# Patient Record
Sex: Male | Born: 1990 | Race: White | Hispanic: No | Marital: Single | Smoking: Never smoker
Health system: Southern US, Community
[De-identification: ages and names within clinical notes are randomized; demographics above are authoritative.]

## PROBLEM LIST (undated history)

## (undated) DIAGNOSIS — R519 Headache, unspecified: Secondary | ICD-10-CM

## (undated) DIAGNOSIS — F419 Anxiety disorder, unspecified: Secondary | ICD-10-CM

## (undated) DIAGNOSIS — R51 Headache: Secondary | ICD-10-CM

## (undated) HISTORY — DX: Headache, unspecified: R51.9

## (undated) HISTORY — DX: Headache: R51

## (undated) HISTORY — PX: PILONIDAL CYST DRAINAGE: SHX743

## (undated) HISTORY — PX: NO PAST SURGERIES: SHX2092

---
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
## 2012-08-05 ENCOUNTER — Encounter (HOSPITAL_COMMUNITY): Payer: Self-pay | Admitting: Emergency Medicine

## 2012-08-05 ENCOUNTER — Emergency Department (INDEPENDENT_AMBULATORY_CARE_PROVIDER_SITE_OTHER)
Admission: EM | Admit: 2012-08-05 | Discharge: 2012-08-05 | Disposition: A | Discharge status: Home / Self Care | Payer: Medicaid Other | Source: Home / Self Care | Attending: Family Medicine | Admitting: Family Medicine

## 2012-08-05 DIAGNOSIS — H6120 Impacted cerumen, unspecified ear: Secondary | ICD-10-CM

## 2012-08-05 DIAGNOSIS — H6122 Impacted cerumen, left ear: Secondary | ICD-10-CM

## 2012-08-05 NOTE — ED Notes (Signed)
Pt c/o bilateral clogged ears onset 1 week w/mild pain States he has this prob ever year  Denies: f/v/n/d, cold sx  He is alert and oriented w/no signs of acute distress.

## 2012-08-05 NOTE — ED Provider Notes (Signed)
History     CSN: [id number]  Arrival date & time 08/05/12  1331   First MD Initiated Contact with Patient 08/05/12 1436      Chief Complaint  Patient presents with  . Ear Fullness    (Consider location/radiation/quality/duration/timing/severity/associated sxs/prior treatment) Patient is a 22 y.o. male presenting with plugged ear sensation. The history is provided by the patient.  Ear Fullness This is a recurrent (recurrent cerumen impaction) problem. The current episode started more than 1 week ago. The problem has not changed since onset.   Past Medical History  Diagnosis Date  . Anxiety     History reviewed. No pertinent past surgical history.  Family History  Problem Relation Age of Onset  . Asthma Brother     History  Substance Use Topics  . Smoking status: Never Smoker   . Smokeless tobacco: Never Used  . Alcohol Use: No      Review of Systems  Constitutional: Negative.   HENT: Positive for hearing loss. Negative for ear pain and congestion.     Allergies  Review of patient's allergies indicates no known allergies.  Home Medications   Current Outpatient Rx  Name  Route  Sig  Dispense  Refill  . Multiple Vitamins-Minerals (MULTIVITAMINS THER. W/MINERALS) TABS   Oral   Take 1 tablet by mouth daily.             BP 145/90  Pulse 79  Temp(Src) 98.5 F (36.9 C) (Oral)  Resp 20  SpO2 98%  Physical Exam  Nursing note and vitals reviewed. Constitutional: He appears well-developed and well-nourished. No distress.  HENT:  Right Ear: External ear normal.  Left Ear: Decreased hearing is noted.  Ears:  Mouth/Throat: Oropharynx is clear and moist.    ED Course  EAR CERUMEN REMOVAL Date/Time: 08/05/2012 3:22 PM Performed by: Linna Hoff Authorized by: Bradd Canary D Consent: Verbal consent obtained. Consent given by: patient Ceruminolytics applied: Ceruminolytics applied prior to the procedure. Location details: left ear Procedure type:  irrigation Patient tolerance: Patient tolerated the procedure well with no immediate complications.   (including critical care time)  Labs Reviewed - No data to display No results found.   1. Cerumen impaction, left       MDM  Cerumen irrig, sx resolved.        Linna Hoff, MD 08/05/12 [phone]

## 2013-02-13 ENCOUNTER — Encounter (HOSPITAL_COMMUNITY): Payer: Self-pay | Admitting: Emergency Medicine

## 2013-02-13 ENCOUNTER — Emergency Department (HOSPITAL_COMMUNITY)
Admission: EM | Admit: 2013-02-13 | Discharge: 2013-02-13 | Disposition: A | Discharge status: Home / Self Care | Payer: Medicaid Other | Attending: Emergency Medicine | Admitting: Emergency Medicine

## 2013-02-13 DIAGNOSIS — R350 Frequency of micturition: Secondary | ICD-10-CM | POA: Insufficient documentation

## 2013-02-13 DIAGNOSIS — R300 Dysuria: Secondary | ICD-10-CM | POA: Insufficient documentation

## 2013-02-13 DIAGNOSIS — H6120 Impacted cerumen, unspecified ear: Secondary | ICD-10-CM | POA: Insufficient documentation

## 2013-02-13 DIAGNOSIS — R3911 Hesitancy of micturition: Secondary | ICD-10-CM | POA: Insufficient documentation

## 2013-02-13 DIAGNOSIS — R34 Anuria and oliguria: Secondary | ICD-10-CM | POA: Insufficient documentation

## 2013-02-13 DIAGNOSIS — Z8659 Personal history of other mental and behavioral disorders: Secondary | ICD-10-CM | POA: Insufficient documentation

## 2013-02-13 LAB — URINALYSIS, ROUTINE W REFLEX MICROSCOPIC
Bilirubin Urine: NEGATIVE
Hgb urine dipstick: NEGATIVE
Nitrite: NEGATIVE
Protein, ur: NEGATIVE mg/dL
Urobilinogen, UA: 1 mg/dL (ref 0.0–1.0)

## 2013-02-13 NOTE — ED Provider Notes (Signed)
CSN: [id number]     Arrival date & time 02/13/13  1844 History   None    This chart was scribed for Antony Madura, by Ladona Ridgel Day, ED scribe. This patient was seen in room WTR9/WTR9 and the patient's care was started at 1844.  Chief Complaint  Patient presents with  . Dysuria    hesitency with urination, pressure in bladder x 10 hrs  . Cerumen Impaction    recurrent wax in ears   The history is provided by the patient. No language interpreter was used.   HPI Comments: Austin Blanchard is a 22 y.o. male who presents to the Emergency Department complaining of urinary hesitancy and decreased duration of urine flow, onset this AM. He states he feels unable to void normal amount of urine and feels that he still needs to go after voiding. He states is not sexually active and not concerned for any STIs. He denies penile d/c or swelling and denies swelling/redness of scrotum. He denies associated dysuria, abdominal pain, nausea, emesis, back pain, fever, hematuria.   He also c/o excess cerumen in bilateral ears.  No medical hx.  Past Medical History  Diagnosis Date  . Anxiety    History reviewed. No pertinent past surgical history. Family History  Problem Relation Age of Onset  . Asthma Brother   . Diabetes Other    History  Substance Use Topics  . Smoking status: Never Smoker   . Smokeless tobacco: Never Used  . Alcohol Use: No    Review of Systems  Constitutional: Negative for fever and chills.  Respiratory: Negative for shortness of breath.   Gastrointestinal: Negative for nausea and vomiting.  Genitourinary: Positive for frequency and decreased urine volume. Negative for dysuria, hematuria, flank pain, discharge, penile swelling, scrotal swelling, penile pain and testicular pain.  Neurological: Negative for weakness.  All other systems reviewed and are negative.  A complete 10 system review of systems was obtained and all systems are negative except as noted in the HPI and  PMH.   Allergies  Review of patient's allergies indicates no known allergies.  Home Medications  No current outpatient prescriptions on file. Triage Vitals: BP 138/82  Pulse 88  Temp(Src) 98.1 F (36.7 C) (Oral)  Resp 20  SpO2 100%  Physical Exam  Nursing note and vitals reviewed. Constitutional: He is oriented to person, place, and time. He appears well-developed and well-nourished. No distress.  HENT:  Head: Normocephalic and atraumatic.  Right Ear: External ear normal.  Left Ear: External ear normal.  Cerumen appreciated in b/l canals without impaction. Canals and TMs normal b/l with erythema. No TTP on palpation of external ears b/l. Hearing grossly intact b/l.  Eyes: EOM are normal.  Neck: Neck supple. No tracheal deviation present.  Cardiovascular: Normal rate, regular rhythm, normal heart sounds and intact distal pulses.   No murmur heard. Pulmonary/Chest: Effort normal and breath sounds normal. No respiratory distress. He has no wheezes. He has no rales.  Abdominal: Soft. Bowel sounds are normal. He exhibits no distension. There is no tenderness. There is no rebound and no guarding.  Musculoskeletal: Normal range of motion. He exhibits no tenderness.  No CVA tenderness  Neurological: He is alert and oriented to person, place, and time.  Skin: Skin is warm and dry.  Psychiatric: He has a normal mood and affect. His behavior is normal.    ED Course  Procedures (including critical care time) DIAGNOSTIC STUDIES: Oxygen Saturation is 100% on room air, normal by  my interpretation.    COORDINATION OF CARE: At 810 PM Discussed treatment plan with patient which includes UA and culture, bladder scan . Patient agrees.   Labs Review Labs Reviewed  URINE CULTURE  URINALYSIS, ROUTINE W REFLEX MICROSCOPIC   Imaging Review No results found.  EKG Interpretation   None       MDM   1. Dysuria    Patient presents for sensation of incomplete bladder emptying with  urgency and frequency. Patient well and nontoxic appearing, hemodynamically stable, and afebrile. Physical exam unremarkable and UA non-suggestive of infection. Urine culture sent which is pending. Bladder scan normal - 62cc. Have recommended further work up with GC/Chlamydia culture, which patient declines at this time as he denies a hx of sexual activity. Believe patient's symptoms are secondary to bladder spasm which are likely transient. Still, however, will refer to urology for further evaluation of symptoms. Return precautions discussed and patient agreeable to plan with no unaddressed concerns. Patient stable for d/c.  I personally performed the services described in this documentation, which was scribed in my presence. The recorded information has been reviewed and is accurate.       Antony Madura, PA-C 02/21/13 2037

## 2013-02-13 NOTE — ED Notes (Signed)
Pt reports pressure, fullness and hesitancy on urination. Denies burning. Also, c/o recurrent wax in both ears

## 2013-02-13 NOTE — ED Notes (Signed)
Bladder scan 62ml. PA aware.

## 2013-02-14 LAB — URINE CULTURE

## 2013-02-24 NOTE — ED Provider Notes (Signed)
  Medical screening examination/treatment/procedure(s) were performed by non-physician practitioner and as supervising physician I was immediately available for consultation/collaboration.      Gerhard Munch, MD 02/24/13 1043

## 2015-01-03 ENCOUNTER — Emergency Department (HOSPITAL_COMMUNITY)
Admission: EM | Admit: 2015-01-03 | Discharge: 2015-01-03 | Disposition: A | Discharge status: Home / Self Care | Payer: Medicaid Other | Attending: Emergency Medicine | Admitting: Emergency Medicine

## 2015-01-03 ENCOUNTER — Encounter (HOSPITAL_COMMUNITY): Payer: Self-pay | Admitting: *Deleted

## 2015-01-03 DIAGNOSIS — L03811 Cellulitis of head [any part, except face]: Secondary | ICD-10-CM

## 2015-01-03 DIAGNOSIS — L731 Pseudofolliculitis barbae: Secondary | ICD-10-CM | POA: Insufficient documentation

## 2015-01-03 DIAGNOSIS — Z8659 Personal history of other mental and behavioral disorders: Secondary | ICD-10-CM | POA: Insufficient documentation

## 2015-01-03 MED ORDER — CEPHALEXIN 500 MG PO CAPS: 500.0000 mg | ORAL_CAPSULE | Freq: Two times a day (BID) | ORAL | Status: DC

## 2015-01-03 MED ORDER — CEPHALEXIN 500 MG PO CAPS
500.0000 mg | ORAL_CAPSULE | Freq: Once | ORAL | Status: AC
  Administered 2015-01-03: 500 mg via ORAL
  Filled 2015-01-03: qty 1

## 2015-01-03 NOTE — Discharge Instructions (Signed)
Cellulitis Cellulitis is an infection of the skin and the tissue beneath it. The infected area is usually red and tender. Cellulitis occurs most often in the arms and lower legs.  CAUSES  Cellulitis is caused by bacteria that enter the skin through cracks or cuts in the skin. The most common types of bacteria that cause cellulitis are staphylococci and streptococci. SIGNS AND SYMPTOMS   Redness and warmth.  Swelling.  Tenderness or pain.  Fever. DIAGNOSIS  Your health care provider can usually determine what is wrong based on a physical exam. Blood tests may also be done. TREATMENT  Treatment usually involves taking an antibiotic medicine. HOME CARE INSTRUCTIONS   Take your antibiotic medicine as directed by your health care provider. Finish the antibiotic even if you start to feel better.  Keep the infected arm or leg elevated to reduce swelling.  Apply a warm cloth to the affected area up to 4 times per day to relieve pain.  Take medicines only as directed by your health care provider.  Keep all follow-up visits as directed by your health care provider. SEEK MEDICAL CARE IF:   You notice red streaks coming from the infected area.  Your red area gets larger or turns dark in color.  Your bone or joint underneath the infected area becomes painful after the skin has healed.  Your infection returns in the same area or another area.  You notice a swollen bump in the infected area.  You develop new symptoms.  You have a fever. SEEK IMMEDIATE MEDICAL CARE IF:   You feel very sleepy.  You develop vomiting or diarrhea.  You have a general ill feeling (malaise) with muscle aches and pains. MAKE SURE YOU:   Understand these instructions.  Will watch your condition.  Will get help right away if you are not doing well or get worse. Document Released: 12/28/2004 Document Revised: 08/04/2013 Document Reviewed: 06/05/2011 [facility] Patient Information 2015 [place], [place].  This information is not intended to replace advice given to you by your health care provider. Make sure you discuss any questions you have with your health care provider.  Ingrown Hair An ingrown hair is a hair that curls and re-enters the skin instead of growing straight out of the skin. It happens most often with curly hair. It is usually more severe in the neck area, but it can occur in any shaved area, including the beard area, groin, scalp, and legs. An ingrown hair may cause small pockets of infection. CAUSES  Shaving closely, tweezing, or waxing, especially curly hair. Using hair removal creams can sometimes lead to ingrown hairs, especially in the groin. SYMPTOMS   Small bumps on the skin. The bumps may be filled with pus.  Pain.  Itching. DIAGNOSIS  Your caregiver can usually tell what is wrong by doing a physical exam. TREATMENT  If there is a severe infection, your caregiver may prescribe antibiotic medicines. Laser hair removal may also be done to help prevent regrowth of the hair. HOME CARE INSTRUCTIONS   Do not shave irritated skin. You may start shaving again once the irritation has gone away.  If you are prone to ingrown hairs, consider not shaving as much as possible.  If antibiotics are prescribed, take them as directed. Finish them even if you start to feel better.  You may use a facial sponge in a gentle circular motion to help dislodge ingrown hairs on the face.  You may use a hair removal cream weekly, especially on  the legs and underarms. Stop using the cream if it irritates your skin. Use caution when using hair removal creams in the groin area. SHAVING INSTRUCTIONS AFTER TREATMENT  Shower before shaving. Keep areas to be shaved packed in warm, moist wraps for several minutes before shaving. The warm, moist environment helps soften the hairs and makes ingrown hairs less likely to occur.  Use thick shaving gels.  Use a bump fighter razor that cuts hair  slightly above the skin level or use an electric shaver with a longer shave setting.  Shave in the direction of hair growth. Avoid making multiple razor strokes.  Use moisturizing lotions after shaving. Document Released: 06/26/2000 Document Revised: 09/19/2011 Document Reviewed: 06/20/2011 [facility] Patient Information 2015 [place], [place]. This information is not intended to replace advice given to you by your health care provider. Make sure you discuss any questions you have with your health care provider.

## 2015-01-03 NOTE — ED Provider Notes (Signed)
CSN: [id number]     Arrival date & time 01/03/15  1625 History   First MD Initiated Contact with Patient 01/03/15 1729     Chief Complaint  Patient presents with  . Headache  . Eye Pain    right     (Consider location/radiation/quality/duration/timing/severity/associated sxs/prior Treatment) HPI  Austin Blanchard is a 24 y.o. male  PCP: No PCP Per Patient  Blood pressure 133/81, pulse 103, temperature 98.1 F (36.7 C), temperature source Oral, resp. rate 18, height 6' (1.829 m), weight 275 lb (124.739 kg), SpO2 100 %.  SIGNIFICANT PMH: anxiety CHIEF COMPLAINT: bump on head, scalp pain.  When: 3 weeks ago patient noticed when he pushes on the bump then he gets shooting pains into his right eye but then he gets a migraine that last a few hours. It resolves with Ibuprofen.  Mechanism: pushing on the bump on his head Chronicity: past 3 weeks, never has happened before Location: top of scalp Radiation: raidates to right eye Quality and severity: shooting pain that lasts a second Treatments tried: Ibuprofen with relief Alleviating factors: Ibuprofen, not touching the bump Worsening factors: touching the bump Associated Symptoms: headaches, shooting pains Risk Factors: none.  Negative ROS: Confusion, diaphoresis, fever, eakness (general or focal), change of vision,  neck pain, dysphagia, aphagia, chest pain, shortness of breath,  back pain, abdominal pains, nausea, vomiting, diarrhea, lower extremity swelling,     Past Medical History  Diagnosis Date  . Anxiety    History reviewed. No pertinent past surgical history. Family History  Problem Relation Age of Onset  . Asthma Brother   . Diabetes Other    Social History  Substance Use Topics  . Smoking status: Never Smoker   . Smokeless tobacco: Never Used  . Alcohol Use: No    Review of Systems  10 Systems reviewed and are negative for acute change except as noted in the HPI.     Allergies  Review of patient's  allergies indicates no known allergies.  Home Medications   Prior to Admission medications   Medication Sig Start Date End Date Taking? Authorizing Provider  Cholecalciferol (VITAMIN D PO) Take 1 tablet by mouth 3 (three) times a week.   Yes Historical Provider, MD  ibuprofen (ADVIL,MOTRIN) 200 MG tablet Take 200 mg by mouth every 6 (six) hours as needed for headache or moderate pain.   Yes Historical Provider, MD  cephALEXin (KEFLEX) 500 MG capsule Take 1 capsule (500 mg total) by mouth 2 (two) times daily. 01/03/15   Aadon Gorelik Neva Seat, PA-C   BP 133/81 mmHg  Pulse 103  Temp(Src) 98.1 F (36.7 C) (Oral)  Resp 18  Ht 6' (1.829 m)  Wt 275 lb (124.739 kg)  BMI 37.29 kg/m2  SpO2 100% Physical Exam  Constitutional: He appears well-developed and well-nourished. No distress.  HENT:  Head: Normocephalic and atraumatic.  Right Ear: Tympanic membrane and ear canal normal.  Left Ear: Tympanic membrane and ear canal normal.  Nose: Nose normal.  Mouth/Throat: Uvula is midline and oropharynx is clear and moist.  Scalp has small pustule with associated cellulitis, cellulitis is approximately 2 x 3 cm in diameter. No associated fluctuance. I am able to illicit shooting pain when I press on the cellulitis.  Eyes: Pupils are equal, round, and reactive to light.  Neck: Normal range of motion. Neck supple.  Cardiovascular: Normal rate and regular rhythm.   Pulmonary/Chest: Effort normal.  Abdominal: Soft.  Neurological: He is alert.  Skin: Skin is warm  and dry.  Nursing note and vitals reviewed.   ED Course  Procedures (including critical care time) Labs Review Labs Reviewed - No data to display  Imaging Review No results found. I have personally reviewed and evaluated these images and lab results as part of my medical decision-making.   EKG Interpretation None      MDM   Final diagnoses:  Ingrown hair  Cellulitis of head except face    Afebrile, well appearing. Pt symptoms are  consistent with cellulitis associated with an ingrown hair, very mild. Will rx Keflex.   Presentation is non concerning for [facility], ICH, Meningitis, or temporal arteritis. Pt is afebrile with no focal neuro deficits, nuchal rigidity, or change in vision. The patient denies any symptoms of neurological impairment or TIA's; no amaurosis, diplopia, dysphasia, or unilateral disturbance of motor or sensory function. No loss of balance or vertigo.   Medications  cephALEXin (KEFLEX) capsule 500 mg (not administered)    24 y.o.[name]'s medical screening exam was performed and I feel the patient has had an appropriate workup for their chief complaint at this time and likelihood of emergent condition existing is low. They have been counseled on decision, discharge, follow up and which symptoms necessitate immediate return to the emergency department. They or their family verbally stated understanding and agreement with plan and discharged in stable condition.   Vital signs are stable at discharge. Filed Vitals:   01/03/15 1633  BP: 133/81  Pulse: 103  Temp: 98.1 F (36.7 C)  Resp: [address], PA-C 01/03/15 1832  Raeford Razor, MD 01/06/15 1425

## 2015-01-03 NOTE — ED Notes (Signed)
Pt stated "about 3 wks ago I noticed a bump on my head.  I didn't hit my head on anything.  It doesn't hurt until I press on it and then it causes the pain to go across my head into my eye.  I've also had a rash on my wrists for a few months.  The eye pain feels like a migraine."  Pt denies photophobia or nausea, but does have dizziness.

## 2015-05-10 ENCOUNTER — Emergency Department (HOSPITAL_COMMUNITY): Payer: Medicaid Other

## 2015-05-10 ENCOUNTER — Emergency Department (HOSPITAL_COMMUNITY)
Admission: EM | Admit: 2015-05-10 | Discharge: 2015-05-10 | Disposition: A | Discharge status: Home / Self Care | Payer: Self-pay | Attending: Emergency Medicine | Admitting: Emergency Medicine

## 2015-05-10 ENCOUNTER — Encounter (HOSPITAL_COMMUNITY): Payer: Self-pay | Admitting: Emergency Medicine

## 2015-05-10 DIAGNOSIS — R519 Headache, unspecified: Secondary | ICD-10-CM

## 2015-05-10 DIAGNOSIS — Z792 Long term (current) use of antibiotics: Secondary | ICD-10-CM | POA: Insufficient documentation

## 2015-05-10 DIAGNOSIS — R51 Headache: Secondary | ICD-10-CM | POA: Insufficient documentation

## 2015-05-10 DIAGNOSIS — Z8659 Personal history of other mental and behavioral disorders: Secondary | ICD-10-CM | POA: Insufficient documentation

## 2015-05-10 DIAGNOSIS — Z79899 Other long term (current) drug therapy: Secondary | ICD-10-CM | POA: Insufficient documentation

## 2015-05-10 NOTE — ED Notes (Signed)
Pt states he has a knot on the top of his head that has been causing him to have migraines. Says the pain is illicited with touch, and that the pain shoots into the back of his eyes. No visualized or palpable mass to head, no injury.

## 2015-05-10 NOTE — ED Provider Notes (Signed)
CSN: [id number]     Arrival date & time 05/10/15  1244 History   First MD Initiated Contact with Patient 05/10/15 1321     Chief Complaint  Patient presents with  . Migraine     (Consider location/radiation/quality/duration/timing/severity/associated sxs/prior Treatment) HPI Comments: Patient here complaining of long-standing pain to the top of his head times several months. Denies any visual changes. Denies any vomiting. Pain is characterized as sharp and worse with palpation and does radiate down to the right side of his neck. Denies any neck pain or weakness in his arms or legs. Patient states that sometimes it triggers a migraine. Is concerned that he might have an underlying mass and sprain and is requesting a head CT. He has never been seen by neurologist  Patient is a 25 y.o. male presenting with migraines. The history is provided by the patient and a parent.  Migraine    Past Medical History  Diagnosis Date  . Anxiety    History reviewed. No pertinent past surgical history. Family History  Problem Relation Age of Onset  . Asthma Brother   . Diabetes Other    Social History  Substance Use Topics  . Smoking status: Never Smoker   . Smokeless tobacco: Never Used  . Alcohol Use: No    Review of Systems  All other systems reviewed and are negative.     Allergies  Review of patient's allergies indicates no known allergies.  Home Medications   Prior to Admission medications   Medication Sig Start Date End Date Taking? Authorizing Provider  b complex vitamins tablet Take 1 tablet by mouth daily.   Yes Historical Provider, MD  Cholecalciferol (VITAMIN D PO) Take 1 tablet by mouth daily.    Yes Historical Provider, MD  ibuprofen (ADVIL,MOTRIN) 200 MG tablet Take 200 mg by mouth every 6 (six) hours as needed for headache or moderate pain.   Yes Historical Provider, MD  Multiple Vitamin (MULTIVITAMIN WITH MINERALS) TABS tablet Take 1 tablet by mouth daily.   Yes  Historical Provider, MD  cephALEXin (KEFLEX) 500 MG capsule Take 1 capsule (500 mg total) by mouth 2 (two) times daily. 01/03/15   Tiffany Neva Seat, PA-C   BP 143/87 mmHg  Pulse 97  Temp(Src) 98.1 F (36.7 C) (Oral)  Resp 16  SpO2 99% Physical Exam  Constitutional: He is oriented to person, place, and time. He appears well-developed and well-nourished.  Non-toxic appearance. No distress.  HENT:  Head: Normocephalic and atraumatic.  Eyes: Conjunctivae, EOM and lids are normal. Pupils are equal, round, and reactive to light.  Neck: Normal range of motion. Neck supple. No tracheal deviation present. No thyroid mass present.  Cardiovascular: Normal rate, regular rhythm and normal heart sounds.  Exam reveals no gallop.   No murmur heard. Pulmonary/Chest: Effort normal and breath sounds normal. No stridor. No respiratory distress. He has no decreased breath sounds. He has no wheezes. He has no rhonchi. He has no rales.  Abdominal: Soft. Normal appearance and bowel sounds are normal. He exhibits no distension. There is no tenderness. There is no rebound and no CVA tenderness.  Musculoskeletal: Normal range of motion. He exhibits no edema or tenderness.  Neurological: He is alert and oriented to person, place, and time. He has normal strength. No cranial nerve deficit or sensory deficit. GCS eye subscore is 4. GCS verbal subscore is 5. GCS motor subscore is 6.  Skin: Skin is warm and dry. No abrasion and no rash noted.  Psychiatric: He  has a normal mood and affect. His speech is normal and behavior is normal.  Nursing note and vitals reviewed.   ED Course  Procedures (including critical care time) Labs Review Labs Reviewed - No data to display  Imaging Review Ct Head Wo Contrast  05/10/2015  CLINICAL DATA:  Headache EXAM: CT HEAD WITHOUT CONTRAST TECHNIQUE: Contiguous axial images were obtained from the base of the skull through the vertex without intravenous contrast. COMPARISON:  04/12/2004  FINDINGS: Ventricle size is normal. Negative for acute or chronic infarction. Negative for hemorrhage or fluid collection. Negative for mass or edema. No shift of the midline structures. Calvarium is intact. IMPRESSION: Normal Electronically Signed   By: Marlan Palau M.D.   On: 05/10/2015 14:55   I have personally reviewed and evaluated these images and lab results as part of my medical decision-making.   EKG Interpretation None      MDM   Final diagnoses:  None    Head CT negative. Neurology referral given    Lorre Nick, MD 05/10/15 1538

## 2015-05-10 NOTE — Discharge Instructions (Signed)

## 2015-05-12 ENCOUNTER — Telehealth: Payer: Self-pay | Admitting: Neurology

## 2015-05-12 ENCOUNTER — Ambulatory Visit (INDEPENDENT_AMBULATORY_CARE_PROVIDER_SITE_OTHER): Payer: Self-pay | Admitting: Neurology

## 2015-05-12 ENCOUNTER — Encounter: Payer: Self-pay | Admitting: Neurology

## 2015-05-12 VITALS — BP 145/88 | HR 99 | Ht 72.0 in | Wt 274.0 lb

## 2015-05-12 DIAGNOSIS — R519 Headache, unspecified: Secondary | ICD-10-CM | POA: Insufficient documentation

## 2015-05-12 DIAGNOSIS — R51 Headache: Secondary | ICD-10-CM

## 2015-05-12 MED ORDER — AMITRIPTYLINE HCL 25 MG PO TABS: 25.0000 mg | ORAL_TABLET | Freq: Every day | ORAL | Status: DC

## 2015-05-12 NOTE — Progress Notes (Addendum)
PATIENT: Austin Blanchard DOB: 03-29-1991  Chief Complaint  Patient presents with  . Headache    He is here with his mother, Amedeo Gory.  He has been having a constant pain on the top of his head for he last 4-5 months.  At times, the pain radiates to the back of his right eye.     HISTORICAL  Austin Blanchard is a 25 years old right-handed male, accompanied by his mother, seen in refer by  emergency room for evaluation of headaches  He began to have headaches since summer of 2016, always triggered by deep pressure at a top of the vertex region, he would develop shooting pain to his right forehead, right retro-orbital area, 4 out of 10, with associated light noise sensitivity, occasionally nauseous, lasting for couple hours, during a headache, he preferred to lie down in dark quiet room, ibuprofen was helpful,  He reported, without pushing on the tenderness trigger spots, he would not have headache,  He actually presented to the emergency room May 10 2015, I have personally reviewed CAT scan of the brain without contrast was normal. REVIEW OF SYSTEMS: Full 14 system review of systems performed and notable only for eye pain, snoring, headaches, not enough sleep  ALLERGIES: No Known Allergies  HOME MEDICATIONS: Current Outpatient Prescriptions  Medication Sig Dispense Refill  . b complex vitamins tablet Take 1 tablet by mouth daily.    . Cholecalciferol (VITAMIN D PO) Take 1 tablet by mouth daily.     . Multiple Vitamin (MULTIVITAMIN WITH MINERALS) TABS tablet Take 1 tablet by mouth daily.     No current facility-administered medications for this visit.    PAST MEDICAL HISTORY: Past Medical History  Diagnosis Date  . Anxiety   . Head pain     PAST SURGICAL HISTORY: Past Surgical History  Procedure Laterality Date  . No past surgeries      FAMILY HISTORY: Family History  Problem Relation Age of Onset  . Asthma Brother   . Diabetes Maternal Grandmother   .  Healthy Mother   . Healthy Father     SOCIAL HISTORY:  Social History   Social History  . Marital Status: Single    Spouse Name: N/A  . Number of Children: 0  . Years of Education: 10   Occupational History  . Unemployed    Social History Main Topics  . Smoking status: Never Smoker   . Smokeless tobacco: Never Used  . Alcohol Use: No  . Drug Use: No  . Sexual Activity: No   Other Topics Concern  . Not on file   Social History Narrative   Lives at home with his mother.   Right-handed.   No caffeine use.     PHYSICAL EXAM   Filed Vitals:   05/12/15 1524  BP: 145/88  Pulse: 99  Height: 6' (1.829 m)  Weight: 274 lb (124.286 kg)    Not recorded      Body mass index is 37.15 kg/(m^2).  PHYSICAL EXAMNIATION:  Gen: NAD, conversant, well nourised, obese, well groomed                     Cardiovascular: Regular rate rhythm, no peripheral edema, warm, nontender. Eyes: Conjunctivae clear without exudates or hemorrhage Neck: Supple, no carotid bruise. Pulmonary: Clear to auscultation bilaterally   NEUROLOGICAL EXAM:  MENTAL STATUS: Speech:    Speech is normal; fluent and spontaneous with normal comprehension.  Cognition:  Orientation to time, place and person     Normal recent and remote memory     Normal Attention span and concentration     Normal Language, naming, repeating,spontaneous speech     Fund of knowledge   CRANIAL NERVES:  He complains of tenderness, shooting pain with deep palpitation at the top of his vortex region, there was no abnormality noticed at skin, and subcutaneous tissues.  CN II: Visual fields are full to confrontation. Fundoscopic exam is normal with sharp discs and no vascular changes. Pupils are round equal and briskly reactive to light. CN III, IV, VI: extraocular movement are normal. No ptosis. CN V: Facial sensation is intact to pinprick in all 3 divisions bilaterally. Corneal responses are intact.  CN VII: Face is  symmetric with normal eye closure and smile. CN VIII: Hearing is normal to rubbing fingers CN IX, X: Palate elevates symmetrically. Phonation is normal. CN XI: Head turning and shoulder shrug are intact CN XII: Tongue is midline with normal movements and no atrophy.  MOTOR: There is no pronator drift of out-stretched arms. Muscle bulk and tone are normal. Muscle strength is normal.  REFLEXES: Reflexes are 2+ and symmetric at the biceps, triceps, knees, and ankles. Plantar responses are flexor.  SENSORY: Intact to light touch, pinprick, position sense, and vibration sense are intact in fingers and toes.  COORDINATION: Rapid alternating movements and fine finger movements are intact. There is no dysmetria on finger-to-nose and heel-knee-shin.    GAIT/STANCE: Posture is normal. Gait is steady with normal steps, base, arm swing, and turning. Heel and toe walking are normal. Tandem gait is normal.  Romberg is absent.   DIAGNOSTIC DATA (LABS, IMAGING, TESTING) - I reviewed patient records, labs, notes, testing and imaging myself where available.   ASSESSMENT AND PLAN  [name] is a 25 y.o. male   Headache  Has migraine features,  The unusual features is his reported trigger spot at vortex region, I do not see any significant skin, or subcutaneous abnormality  I have suggested Neurontin, nortriptyline as preventive medications, patient declined,  I suggested hot compression,  Continue close observation, return to clinic for new issues   Levert Feinstein, M.D. Ph.D.  [facility] [address] [place], [place] [address] Ph: [phone] Fax: [phone]  CC: Referring Provider

## 2015-05-12 NOTE — Telephone Encounter (Signed)
Mom called in stating that pt still has intense pain at the top of head and also right retro-orbital. Has trigger point at vertex with radiation pain at top of head and right eye. Also lying flat makes pain worse, and wakes him up from sleep sometimes or HA in the morning on awaken up. Pain fluctuating for about 3-4 months now.  Denies neck pain or neck tenderness. Denies any aggressive work out but spends significant amount of time with computer.   The description of HA feels like atypical occipital neuralgia or involving the branch of occipital nerve. May consider occipital nerve block or trigger point injection. Mom is encouraged to call in am to talk with Dr. Terrace Arabia.   At the meantime, will prescribe amitriptyline  Qhs for HA / neuralgia. May increase to  Qhs if not effective in 5 days. Pt has no insurance. Mom expressed understanding and appreciation.  Marvel Plan, MD PhD Stroke Neurology 05/12/2015 5:58 PM  Meds ordered this encounter  Medications  . amitriptyline (ELAVIL) 25 MG tablet    Sig: Take 1 tablet (25 mg total) by mouth at bedtime. May increase to  Qhs after 5 days if not effective.    Dispense:  30 tablet    Refill:  1

## 2015-05-13 NOTE — Telephone Encounter (Signed)
Patient's mother is calling. She spoke with Dr. Roda Shutters last night as in notes below. Dr. Roda Shutters called in a Rx for amitriptyline (ELAVIL) 25 MG tablet for the patient for the intense pain in his head to his right eye. The patient went to get this medication at the drug store. The pharmacist told the patient this medication is also used for depression so the patient refused to get it. Please call the patient's mother to discuss.

## 2015-05-13 NOTE — Telephone Encounter (Signed)
Called and spoke to patient's mother - patient is unwilling to take the amitriptyline (because it is an antidepressant) sent in for him or the gabapentin or nortriptyline that was offered to him at his appt (even after an extension discussion educating her about the medications).  I told her to encourage him to try the medications and she agreed to talk with him.

## 2015-06-10 ENCOUNTER — Ambulatory Visit (INDEPENDENT_AMBULATORY_CARE_PROVIDER_SITE_OTHER): Payer: Medicaid Other | Admitting: Neurology

## 2015-06-10 ENCOUNTER — Encounter: Payer: Self-pay | Admitting: Neurology

## 2015-06-10 VITALS — BP 130/78 | HR 99 | Ht 72.0 in | Wt 279.0 lb

## 2015-06-10 DIAGNOSIS — M792 Neuralgia and neuritis, unspecified: Secondary | ICD-10-CM

## 2015-06-10 DIAGNOSIS — G609 Hereditary and idiopathic neuropathy, unspecified: Secondary | ICD-10-CM

## 2015-06-10 NOTE — Progress Notes (Signed)
NEUROLOGY CONSULTATION NOTE  Austin FritzMichael J Blanchard MRN: [id number] DOB: Jun 02, 1990  Referring provider: Lorre NickAnthony Allen, MD (ED referral) Primary care provider: no PCP  Reason for consult:  headache  HISTORY OF PRESENT ILLNESS: Austin GeorgeMichael Blanchard is a 25 year old right-handed male who presents for headaches.  History obtained by patient, his mother and ED note.  Imaging of head CT reviewed.  About 2 months ago, he started having a head pain.  He sometimes notes a dull pain on the crown of his head.  If he presses down on it with his finger, he experiences a shooting pain down the right frontal region to his right eye.  There is no associated burning, dizziness, nausea, phonophobia, osmophobia or visual disturbance.  He has photosensitivity which is separate.  He denies neck pain.  He denies any head trauma.  There has been no rash or warmth to the touch.  He feels that laying down in bed may make it worse.  He has been taking ibuprofen.  It is more of an annoyance rather than debilitating.  He was prescribed amitriptyline but has not started it because of aversion to medication.  He denies history of headache.  His paternal grandfather had headache.  CT of head from 05/10/15 was normal.  PAST MEDICAL HISTORY: Past Medical History  Diagnosis Date  . Anxiety   . Head pain     PAST SURGICAL HISTORY: Past Surgical History  Procedure Laterality Date  . No past surgeries      MEDICATIONS: Current Outpatient Prescriptions on File Prior to Visit  Medication Sig Dispense Refill  . b complex vitamins tablet Take 1 tablet by mouth daily.    . Cholecalciferol (VITAMIN D PO) Take 1 tablet by mouth daily.     . Multiple Vitamin (MULTIVITAMIN WITH MINERALS) TABS tablet Take 1 tablet by mouth daily.    Marland Kitchen. amitriptyline (ELAVIL) 25 MG tablet Take 1 tablet (25 mg total) by mouth at bedtime. May increase to 50mg  Qhs after 5 days if not effective. (Patient not taking: Reported on 06/10/2015) 30 tablet 1     No current facility-administered medications on file prior to visit.    ALLERGIES: No Known Allergies  FAMILY HISTORY: Family History  Problem Relation Age of Onset  . Asthma Brother   . Diabetes Maternal Grandmother   . Stroke Maternal Grandmother   . Healthy Mother   . Healthy Father     SOCIAL HISTORY: Social History   Social History  . Marital Status: Single    Spouse Name: N/A  . Number of Children: 0  . Years of Education: 10   Occupational History  . Unemployed    Social History Main Topics  . Smoking status: Never Smoker   . Smokeless tobacco: Never Used  . Alcohol Use: No  . Drug Use: No  . Sexual Activity: No   Other Topics Concern  . Not on file   Social History Narrative   Lives at home with his mother.   Right-handed.   No caffeine use.    REVIEW OF SYSTEMS: Constitutional: No fevers, chills, or sweats, no generalized fatigue, change in appetite Eyes: No visual changes, double vision, eye pain Ear, nose and throat: No hearing loss, ear pain, nasal congestion, sore throat Cardiovascular: No chest pain, palpitations Respiratory:  No shortness of breath at rest or with exertion, wheezes GastrointestinaI: No nausea, vomiting, diarrhea, abdominal pain, fecal incontinence Genitourinary:  No dysuria, urinary retention or frequency Musculoskeletal:  No neck pain,  back pain Integumentary: No rash, pruritus, skin lesions Neurological: as above Psychiatric: No depression, insomnia, anxiety Endocrine: No palpitations, fatigue, diaphoresis, mood swings, change in appetite, change in weight, increased thirst Hematologic/Lymphatic:  No anemia, purpura, petechiae. Allergic/Immunologic: no itchy/runny eyes, nasal congestion, recent allergic reactions, rashes  PHYSICAL EXAM: Filed Vitals:   06/10/15 1028  BP: 130/78  Pulse: 99   General: No acute distress.  Head:  Normocephalic/atraumatic.  No rash on scalp.  No tenderness to palpation at the  occipital notces. Eyes:  fundi unremarkable, without vessel changes, exudates, hemorrhages or papilledema. Neck: supple, no paraspinal tenderness, full range of motion Back: No paraspinal tenderness Heart: regular rate and rhythm Lungs: Clear to auscultation bilaterally. Vascular: No carotid bruits. Neurological Exam: Mental status: alert and oriented to person, place, and time, recent and remote memory intact, fund of knowledge intact, attention and concentration intact, speech fluent and not dysarthric, language intact. Cranial nerves: CN I: not tested CN II: pupils equal, round and reactive to light, visual fields intact, fundi unremarkable, without vessel changes, exudates, hemorrhages or papilledema. CN III, IV, VI:  full range of motion, no nystagmus, no ptosis CN V: facial sensation intact CN VII: upper and lower face symmetric CN VIII: hearing intact CN IX, X: gag intact, uvula midline CN XI: sternocleidomastoid and trapezius muscles intact CN XII: tongue midline Bulk & Tone: normal, no fasciculations. Motor:  5/5 throughout  Sensation: temperature and vibration sensation intact. Deep Tendon Reflexes:  2+ throughout, toes downgoing.  Finger to nose testing:  Without dysmetria.  Heel to shin:  Without dysmetria.  Gait:  Normal station and stride.  Able to turn and tandem walk. Romberg negative.  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  I would consider occipital neuralgia, however he denies tenderness at the occipital notch.  It is not consistent with any of the typical headache syndromes.  CT of head does not reveal any intracranial abnormalities and his physical exam is unremarkable.  I don't suspect anything serious.  PLAN: Treatment would be symptomatic management.  He feels it is not severe enough to start a daily preventative medication such as amitriptyline.  We discussed that it may resolve on its own.  At this time, he will continue  to monitor and use ibuprofen sparingly.  He will follow up in a few months to see how he is doing.  If symptoms get worse or he would like to start a preventative, he will contact us.  Thank you for allowing me to take part in the care of this patient.  Shon Millet, DO

## 2015-06-10 NOTE — Patient Instructions (Signed)
I think you likely have a neuralgia involving the scalp.  When you touch the area, it radiates to the eye. Very likely, it will go away on its own If needed, we can start a daily medication (usually a medication like the amitriptyline you were prescribed) Follow up in few months or call sooner with problems

## 2015-08-16 ENCOUNTER — Encounter (HOSPITAL_COMMUNITY): Payer: Self-pay | Admitting: Emergency Medicine

## 2015-08-16 ENCOUNTER — Emergency Department (HOSPITAL_COMMUNITY)
Admission: EM | Admit: 2015-08-16 | Discharge: 2015-08-16 | Disposition: A | Discharge status: Home / Self Care | Payer: Self-pay | Attending: Emergency Medicine | Admitting: Emergency Medicine

## 2015-08-16 DIAGNOSIS — Y929 Unspecified place or not applicable: Secondary | ICD-10-CM | POA: Insufficient documentation

## 2015-08-16 DIAGNOSIS — S71102A Unspecified open wound, left thigh, initial encounter: Secondary | ICD-10-CM | POA: Insufficient documentation

## 2015-08-16 DIAGNOSIS — Z79899 Other long term (current) drug therapy: Secondary | ICD-10-CM | POA: Insufficient documentation

## 2015-08-16 DIAGNOSIS — L989 Disorder of the skin and subcutaneous tissue, unspecified: Secondary | ICD-10-CM | POA: Insufficient documentation

## 2015-08-16 DIAGNOSIS — Y939 Activity, unspecified: Secondary | ICD-10-CM | POA: Insufficient documentation

## 2015-08-16 DIAGNOSIS — X58XXXA Exposure to other specified factors, initial encounter: Secondary | ICD-10-CM | POA: Insufficient documentation

## 2015-08-16 DIAGNOSIS — Y999 Unspecified external cause status: Secondary | ICD-10-CM | POA: Insufficient documentation

## 2015-08-16 NOTE — ED Notes (Signed)
Pt c/o a bleeding area on L thigh. Pt sts he thinks it was a mole that he began to scratch. Family sts mole is now hanging off. Bleeding is controlled at this time, but per pt, it was bleeding heavily. A&Ox4 and ambulatory.

## 2015-08-16 NOTE — ED Provider Notes (Signed)
CSN: [id number]     Arrival date & time 08/16/15  1606 History  By signing my name below, I, Tanda Rockers, attest that this documentation has been prepared under the direction and in the presence of [address] [employer]. Electronically Signed: Tanda Rockers, ED Scribe. 08/16/2015. 4:36 PM.   Chief Complaint  Patient presents with  . Extremity Laceration   The history is provided by the patient. No language interpreter was used.    HPI Comments: Austin Blanchard is a 25 y.o. male who presents to the Emergency Department complaining of gradual onset, constant, 4/10, stinging, non-radiating, left posterior thigh pain that began at 2:30 PM today (approximately 2 hours ago). Pt reports that he felt the pain and began picking at the area. At first he thought it was a tick bite due to the area coming off relatively easily but now believes it was a mole. He notes after picking he had a large amount of bleeding to the area, causing concern. Bleeding is currently controlled. Pt still has pain to the area but denies any redness or swelling prior to picking. The pain is exacerbated with palpation. He has not taken anything for the pain.  Denies rash, fever, chills, chest pain, shortness of breath, abdominal pain, nausea, vomiting, diarrhea, constipation, dysuria, hematuria, weakness, numbness, tingling, or any other associated symptoms. Not on anticoagulants.  Past Medical History  Diagnosis Date  . Anxiety   . Head pain    Past Surgical History  Procedure Laterality Date  . No past surgeries     Family History  Problem Relation Age of Onset  . Asthma Brother   . Diabetes Maternal Grandmother   . Stroke Maternal Grandmother   . Healthy Mother   . Healthy Father    Social History  Substance Use Topics  . Smoking status: Never Smoker   . Smokeless tobacco: Never Used  . Alcohol Use: No    Review of Systems  Constitutional: Negative for fever and chills.  Respiratory: Negative for  shortness of breath.   Cardiovascular: Negative for chest pain.  Gastrointestinal: Negative for nausea, vomiting, abdominal pain, diarrhea and constipation.  Genitourinary: Negative for dysuria and hematuria.  Musculoskeletal: Positive for myalgias (left thigh). Negative for joint swelling.  Skin: Positive for wound (self inflicted wound after picking at mole). Negative for color change and rash.  Allergic/Immunologic: Negative for immunocompromised state.  Neurological: Negative for weakness and numbness.  Hematological: Does not bruise/bleed easily.  Psychiatric/Behavioral: Negative for confusion.  A complete 10 system review of systems was obtained and all systems are negative except as noted in the HPI and PMH.   Allergies  Review of patient's allergies indicates no known allergies.  Home Medications   Prior to Admission medications   Medication Sig Start Date End Date Taking? Authorizing Provider  amitriptyline (ELAVIL) 25 MG tablet Take 1 tablet (25 mg total) by mouth at bedtime. May increase to  Qhs after 5 days if not effective. Patient not taking: Reported on 06/10/2015 05/12/15   Marvel Plan, MD  b complex vitamins tablet Take 1 tablet by mouth daily.    Historical Provider, MD  Cholecalciferol (VITAMIN D PO) Take 1 tablet by mouth daily.     Historical Provider, MD  Multiple Vitamin (MULTIVITAMIN WITH MINERALS) TABS tablet Take 1 tablet by mouth daily.    Historical Provider, MD   BP 133/90 mmHg  Pulse 95  Temp(Src) 97.1 F (36.2 C) (Oral)  Resp 16  SpO2 98%   Physical  Exam  Constitutional: He is oriented to person, place, and time. Vital signs are normal. He appears well-developed and well-nourished.  Non-toxic appearance. No distress.  Afebrile, nontoxic, NAD  HENT:  Head: Normocephalic and atraumatic.  Mouth/Throat: Mucous membranes are normal.  Eyes: Conjunctivae and EOM are normal. Right eye exhibits no discharge. Left eye exhibits no discharge.  Neck: Normal  range of motion. Neck supple.  Cardiovascular: Normal rate.   Pulmonary/Chest: Effort normal. No respiratory distress.  Abdominal: Normal appearance. He exhibits no distension.  Musculoskeletal: Normal range of motion.  Neurological: He is alert and oriented to person, place, and time. He has normal strength. No sensory deficit.  Skin: Skin is warm, dry and intact. Lesion noted. No rash noted.  Black circular mole to the posterior left thigh, with the edge slightly raised off the underlying tissue, no ongoing bleeding, no surrounding swelling or erythema, no warmth.   Psychiatric: He has a normal mood and affect.  Nursing note and vitals reviewed.    ED Course  Procedures (including critical care time)  DIAGNOSTIC STUDIES: Oxygen Saturation is 98% on RA, normal by my interpretation.    COORDINATION OF CARE: 4:36 PM-Discussed treatment plan which [facility] with pt at bedside and pt agreed to plan.   MDM   Final diagnoses:  Skin lesion of left leg    25 y.o. male here with black mole on L leg that he scratched partially off, bled somewhat and then stopped. On exam, mole still attached, edge slightly lifted off underlying skin but no ongoing bleeding. Doubt need for removal at this time, doubt need for intervention at this time. Discussed wound care and f[facility] for [facility] medical care and possible biopsy of area. No evidence of infection, doubt need for abx. I explained the diagnosis and have given explicit precautions to return to the ER including for any other new or worsening symptoms. The pt's parents understand and accept the medical plan as it's been dictated and I have answered their questions. Discharge instructions concerning home care and prescriptions have been given. The patient is STABLE and is discharged to home in good condition.   I personally performed the services described in this documentation, which was scribed in my  presence. The recorded information has been reviewed and is accurate.    BP 133/90 mmHg  Pulse 95  Temp(Src) 97.1 F (36.2 C) (Oral)  Resp 16  SpO2 98%  No orders of the defined types were placed in this encounter.     [id number] [place]ub[place], PA-C 08/16/15 1639  Raeford RazorStephen Kohut, MD 08/17/15 2004

## 2015-08-16 NOTE — Discharge Instructions (Signed)
Use tylenol and motrin as needed for pain. Use topical antibiotic ointment and bandage to the area until it heals. Follow up with [facility] in 1 week to establish medical care and for ongoing management of your mole. Return to the ER for changes or worsening symptoms.   Excision of Skin Lesions Excision of a skin lesion refers to the removal of a section of skin by making small cuts (incisions) in the skin. This procedure may be done to remove a cancerous (malignant) or noncancerous (benign) growth on the skin. It is typically done to treat or prevent cancer or infection. It may also be done to improve cosmetic appearance. The procedure may be done to remove:  Cancerous growths, such as basal cell carcinoma, squamous cell carcinoma, or melanoma.  Noncancerous growths, such as a cyst or lipoma.  Growths, such as moles or skin tags, which may be removed for cosmetic reasons. Various excision or surgical techniques may be used depending on your condition, the location of the lesion, and your overall health. LET [facility] PROVID[facility] KNOW ABOUT:  Any allergies you have.  All medicines you are taking, including vitamins, herbs, eye drops, creams, and over-the-counter medicines.  Previous problems you or members of your family have had with the use of anesthetics.  Any blood disorders you have.  Previous surgeries you have had.  Any medical conditions you have.  Whether you are pregnant or may be pregnant. RISKS AND COMPLICATIONS Generally, this is a safe procedure. However, problems may occur, including:  Bleeding.  Infection.  Scarring.  Recurrence of the cyst, lipoma, or cancer.  Changes in skin sensation or appearance, such as discoloration or swelling.  Reaction to the anesthetics.  Allergic reaction to surgical materials or ointments.  Damage to nerves, blood vessels, muscles, or other structures.  Continued pain. BEFORE THE PROCEDURE  Ask your  health care provider about:  Changing or stopping your regular medicines. This is especially important if you are taking diabetes medicines or blood thinners.  Taking medicines such as aspirin and ibuprofen. These medicines can thin your blood. Do not take these medicines before your procedure if your health care provider instructs you not to.  You may be asked to take certain medicines.  You may be asked to stop smoking.  You may have an exam or testing.  Plan to have someone take you home after the procedure.  Plan to have someone help you with activities during recovery. PROCEDURE  To reduce your risk of infection:  Your health care team will wash or sanitize their hands.  Your skin will be washed with soap.  You will be given a medicine to numb the area (local anesthetic).  One of the following excision techniques will be performed.  At the end of any of these procedures, antibiotic ointment will be applied as needed. Each of the following techniques may vary among health care providers and hospitals. Complete Surgical Excision The area of skin that needs to be removed will be marked with a pen. Using a small scalpel or scissors, the surgeon will gently cut around and under the lesion until it is completely removed. The lesion will be placed in a fluid and sent to the lab for examination. If necessary, bleeding will be controlled with a device that delivers heat (electrocautery). The edges of the wound may be stitched (sutured) together, and a bandage (dressing) will be applied. This procedure may be performed to treat a cancerous growth or a  noncancerous cyst or lesion. Excision of a Cyst The surgeon will make an incision on the cyst. The entire cyst will be removed through the incision. The incision may be closed with sutures. Shave Excision During shave excision, the surgeon will use a small blade or an electrically heated loop instrument to shave off the lesion. This may be  done to remove a mole or a skin tag. The wound will usually be left to heal on its own without sutures. Punch Excision During punch excision, the surgeon will use a small tool that is like a cookie cutter or a hole punch to cut a circle shape out of the skin. The outer edges of the skin will be sutured together. This may be done to remove a mole or a scar or to perform a biopsy of the lesion. Mohs Micrographic Surgery During Mohs micrographic surgery, layers of the lesion will be removed with a scalpel or a loop instrument and will be examined right away under a microscope. Layers will be removed until all of the abnormal or cancerous tissue has been removed. This procedure is minimally invasive, and it ensures the best cosmetic outcome. It involves the removal of as little normal tissue as possible. Mohs is usually done to treat skin cancer, such as basal cell carcinoma or squamous cell carcinoma, particularly on the face and ears. Depending on the size of the surgical wound, it may be sutured closed. AFTER THE PROCEDURE  Return to your normal activities as told by your health care provider.  Talk with your health care provider to discuss any test results, treatment options, and if necessary, the need for more tests.   This information is not intended to replace advice given to you by your health care provider. Make sure you discuss any questions you have with your health care provider.   Document Released: 06/14/2009 Document Revised: 12/09/2014 Document Reviewed: 05/06/2014 Elsevier Interactive Patient Education 2016 Else[employer] Inc[employer]  Skin Biopsy WHY AM I HAVING THIS TEST? This test examines skin tissue under a microscope. Your health care provider may perform this test to identify the source of a skin rash or lesion if an allergy is suspected. In this test, a tissue sample (biopsy) is taken to look at your skin's anatomy and to see if certain proteins and antibodies are present. WHAT KIND OF  SAMPLE IS TAKEN? A small sample of skin is required for this test. It is usually collected by numbing an area of skin and removing a small circle of skin. HOW DO I PREPARE FOR THE TEST? There is no preparation required for this test. HOW ARE THE TEST RESULTS REPORTED? Your results may be reported in different ways and are dependent upon the kind of test that is performed. It is your responsibility to obtain your test results. Ask the lab or department performing the test when and how you will get your results. It is most common for your results to be reported as:  Normal skin anatomy (histology).  No evidence of IgG, IgA, or IgM antibody, complement C3, or fibrinogen. WHAT DO THE RESULTS MEAN? Abnormal findings may indicate certain autoimmune diseases. This test can produce many different results. It is important to talk with your health care provider to discuss your results, treatment options, and if necessary, the need for more tests. Talk with your health care provider if you have any questions about your results.   This information is not intended to replace advice given to you by your health care  provider. Make sure you discuss any questions you have with your health care provider.   Document Released: 04/21/2004 Document Revised: 04/10/2014 Document Reviewed: 06/17/2014 Elsevier Interactive Patient Education [employer]

## 2015-10-01 ENCOUNTER — Ambulatory Visit: Payer: Self-pay | Admitting: Neurology

## 2015-11-05 ENCOUNTER — Ambulatory Visit: Payer: Self-pay | Admitting: Neurology

## 2017-11-02 ENCOUNTER — Other Ambulatory Visit: Payer: Self-pay

## 2017-11-02 ENCOUNTER — Encounter (HOSPITAL_BASED_OUTPATIENT_CLINIC_OR_DEPARTMENT_OTHER): Payer: Self-pay

## 2017-11-02 ENCOUNTER — Emergency Department (HOSPITAL_BASED_OUTPATIENT_CLINIC_OR_DEPARTMENT_OTHER)
Admission: EM | Admit: 2017-11-02 | Discharge: 2017-11-02 | Disposition: A | Discharge status: Home / Self Care | Payer: Self-pay | Attending: Emergency Medicine | Admitting: Emergency Medicine

## 2017-11-02 DIAGNOSIS — Y92512 Supermarket, store or market as the place of occurrence of the external cause: Secondary | ICD-10-CM | POA: Insufficient documentation

## 2017-11-02 DIAGNOSIS — S40811A Abrasion of right upper arm, initial encounter: Secondary | ICD-10-CM | POA: Insufficient documentation

## 2017-11-02 DIAGNOSIS — Y9301 Activity, walking, marching and hiking: Secondary | ICD-10-CM | POA: Insufficient documentation

## 2017-11-02 DIAGNOSIS — Z23 Encounter for immunization: Secondary | ICD-10-CM | POA: Insufficient documentation

## 2017-11-02 DIAGNOSIS — W2209XA Striking against other stationary object, initial encounter: Secondary | ICD-10-CM | POA: Insufficient documentation

## 2017-11-02 DIAGNOSIS — Z79899 Other long term (current) drug therapy: Secondary | ICD-10-CM | POA: Insufficient documentation

## 2017-11-02 DIAGNOSIS — Y999 Unspecified external cause status: Secondary | ICD-10-CM | POA: Insufficient documentation

## 2017-11-02 MED ORDER — TETANUS-DIPHTH-ACELL PERTUSSIS 5-2.5-18.5 LF-MCG/0.5 IM SUSP
0.5000 mL | Freq: Once | INTRAMUSCULAR | Status: AC
  Administered 2017-11-02: 0.5 mL via INTRAMUSCULAR
  Filled 2017-11-02: qty 0.5

## 2017-11-02 NOTE — ED Triage Notes (Signed)
Pt states he ran into metal spice rack at the grocery store yesterday-abrasion noted to right upper FA-NAD-steady gait

## 2017-11-03 NOTE — ED Provider Notes (Signed)
[facility] EMERGENCY DEPARTMENT Provider Note   CSN: [id number] Arrival date & time: 11/02/17  1650     History   Chief Complaint Chief Complaint  Patient presents with  . Abrasion    HPI Austin Blanchard is a 27 y.o. male.  Abrasion from sharp metal on a rack in a store just PTA. Initially with bleeding, not now.    Wound Check  This is a new problem. The current episode started 1 to 2 hours ago. The problem occurs constantly. The problem has not changed since onset.Pertinent negatives include no chest pain, no abdominal pain and no headaches. Nothing aggravates the symptoms. Nothing relieves the symptoms. He has tried nothing for the symptoms.    Past Medical History:  Diagnosis Date  . Anxiety   . Head pain     Patient Active Problem List   Diagnosis Date Noted  . Headache 05/12/2015  . OBESITY, NOS 05/31/2006  . 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 NO PAST SURGERIES    . PILONIDAL CYST DRAINAGE          Home Medications    Prior to Admission medications   Medication Sig Start Date End Date Taking? Authorizing Provider  b complex vitamins tablet Take 1 tablet by mouth daily.    [provider]  Cholecalciferol (VITAMIN D PO) Take 1 tablet by mouth daily.     [provider]  Multiple Vitamin (MULTIVITAMIN WITH MINERALS) TABS tablet Take 1 tablet by mouth daily.    [provider]    Family History Family History  Problem Relation Age of Onset  . Healthy Mother   . Healthy Father   . Asthma Brother   . Diabetes Maternal Grandmother   . Stroke Maternal Grandmother     Social History Social History   Tobacco Use  . Smoking status: Never Smoker  . Smokeless tobacco: Never Used  Substance Use Topics  . Alcohol use: No  . Drug use: No     Allergies   Patient has no known allergies.   Review of Systems Review of Systems  Cardiovascular: Negative for  chest pain.  Gastrointestinal: Negative for abdominal pain.  Neurological: Negative for headaches.  All other systems reviewed and are negative.    Physical Exam Updated Vital Signs BP (!) 144/82 (BP Location: Left Arm)   Pulse 95   Temp 98.4 F (36.9 C) (Oral)   Resp 18   Ht 6\' 1"  (1.854 m)   Wt 121.6 kg (268 lb)   SpO2 100%   BMI 35.36 kg/m   Physical Exam  Constitutional: He is oriented to person, place, and time. He appears well-developed and well-nourished.  HENT:  Head: Normocephalic and atraumatic.  Eyes: Conjunctivae and EOM are normal.  Neck: Normal range of motion.  Cardiovascular: Normal rate.  Pulmonary/Chest: Effort normal. No respiratory distress.  Abdominal: Soft. Bowel sounds are normal. He exhibits no distension.  Musculoskeletal: Normal range of motion.  Neurological: He is alert and oriented to person, place, and time. No cranial nerve deficit. Coordination normal.  Skin:  Small redness to right arm without open wound currently.   Nursing note and vitals reviewed.    ED Treatments / Results  Labs (all labs ordered are listed, but only abnormal results are displayed) Labs Reviewed - No data to display  EKG None  Radiology No results found.  Procedures Procedures (including critical care time)  Medications Ordered in  ED Medications  Tdap (BOOSTRIX) injection 0.5 mL (0.5 mLs Intramuscular Given 11/02/17 1745)     Initial Impression / Assessment and Plan / ED Course  I have reviewed the triage vital signs and the nursing notes.  Pertinent labs & imaging results that were available during my care of the patient were reviewed by me and considered in my medical decision making (see chart for details).     Small abrasion. tdap updated. No e/o infection. No wound requiring repair.   Final Clinical Impressions(s) / ED Diagnoses   Final diagnoses:  Abrasion of right upper extremity, initial encounter    ED Discharge Orders    None        Ryonna Cimini, Barbara Cower, MD 11/05/17 1452

## 2017-11-18 ENCOUNTER — Encounter (HOSPITAL_BASED_OUTPATIENT_CLINIC_OR_DEPARTMENT_OTHER): Payer: Self-pay | Admitting: *Deleted

## 2017-11-18 ENCOUNTER — Emergency Department (HOSPITAL_BASED_OUTPATIENT_CLINIC_OR_DEPARTMENT_OTHER)
Admission: EM | Admit: 2017-11-18 | Discharge: 2017-11-18 | Disposition: A | Discharge status: Home / Self Care | Payer: Self-pay | Attending: Emergency Medicine | Admitting: Emergency Medicine

## 2017-11-18 ENCOUNTER — Other Ambulatory Visit: Payer: Self-pay

## 2017-11-18 DIAGNOSIS — Z79899 Other long term (current) drug therapy: Secondary | ICD-10-CM | POA: Insufficient documentation

## 2017-11-18 DIAGNOSIS — M545 Low back pain: Secondary | ICD-10-CM | POA: Insufficient documentation

## 2017-11-18 DIAGNOSIS — M5489 Other dorsalgia: Secondary | ICD-10-CM

## 2017-11-18 MED ORDER — METHOCARBAMOL 500 MG PO TABS: 500.0000 mg | ORAL_TABLET | Freq: Every day | ORAL | 0 refills | Status: AC

## 2017-11-18 NOTE — ED Triage Notes (Signed)
Reports lower back pain x 1 week. Denies injury. Increased pain with movement and after first getting out of bed in the morning. Denies dysuria

## 2017-11-18 NOTE — ED Provider Notes (Signed)
[facility] EMERGENCY DEPARTMENT Provider Note   CSN: [id number] Arrival date & time: 11/18/17  1737     History   Chief Complaint Chief Complaint  Patient presents with  . Back Pain    HPI Austin Blanchard is a 27 y.o. male.  27 y.o male with no PMH presents to ED with a chief complaint of back pain x 1 1/2 week. Patient reports the pain started after he has been increasing his exercise level, but using the elliptical, running up the treadmill, and walking longer than 2 miles per day.Patient reports his pain along the lumbar region, he describes it as ache and constant worst with movement and better throughout the day.  He reports the pain improves while he is actively exercising. Patient has tried tylenol, Advil, tylenol extra strength but reports no improvement in symptoms. Patient states "wake up with pain". Patient denies any fever, IV drug use, bowel or bladder incontinence, shortness of breath or chest pain.       Past Medical History:  Diagnosis Date  . Anxiety   . Head pain     Patient Active Problem List   Diagnosis Date Noted  . Headache 05/12/2015  . OBESITY, NOS 05/31/2006  . 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 NO PAST SURGERIES    . PILONIDAL CYST DRAINAGE          Home Medications    Prior to Admission medications   Medication Sig Start Date End Date Taking? Authorizing Provider  b complex vitamins tablet Take 1 tablet by mouth daily.    [provider]  Cholecalciferol (VITAMIN D PO) Take 1 tablet by mouth daily.     [provider]  methocarbamol (ROBAXIN) 500 MG tablet Take 1 tablet (500 mg total) by mouth daily for 7 days. 11/18/17 11/25/17  Claude MangesSoto, Natalia Wittmeyer, PA-C  Multiple Vitamin (MULTIVITAMIN WITH MINERALS) TABS tablet Take 1 tablet by mouth daily.    [provider]    Family History Family History  Problem Relation Age of Onset  . Healthy Mother   .  Healthy Father   . Asthma Brother   . Diabetes Maternal Grandmother   . Stroke Maternal Grandmother     Social History Social History   Tobacco Use  . Smoking status: Never Smoker  . Smokeless tobacco: Never Used  Substance Use Topics  . Alcohol use: No  . Drug use: No     Allergies   Patient has no known allergies.   Review of Systems Review of Systems  Constitutional: Negative for chills and fever.  HENT: Negative for ear pain and sore throat.   Eyes: Negative for pain and visual disturbance.  Respiratory: Negative for cough and shortness of breath.   Cardiovascular: Negative for chest pain and palpitations.  Gastrointestinal: Negative for abdominal pain and vomiting.  Genitourinary: Negative for dysuria and hematuria.  Musculoskeletal: Positive for back pain (lower paraspinal region). Negative for arthralgias.  Skin: Negative for color change and rash.  Neurological: Negative for seizures and syncope.  All other systems reviewed and are negative.    Physical Exam Updated Vital Signs BP 134/81 (BP Location: Left Arm)   Pulse 86   Temp 98.6 F (37 C) (Oral)   Resp 18   SpO2 100%   Physical Exam  Constitutional: He is oriented to person, place, and time. He appears well-developed and well-nourished.  HENT:  Head: Normocephalic and atraumatic.  Neck: Normal  range of motion. Neck supple.  Cardiovascular: Normal rate.  Pulmonary/Chest: Effort normal and breath sounds normal. He has no wheezes.  Abdominal: Soft. Bowel sounds are normal.  Musculoskeletal: He exhibits tenderness. He exhibits no deformity.       Lumbar back: He exhibits spasm. He exhibits no tenderness, no bony tenderness, no swelling, no deformity, no laceration and no pain.       Back:  Patient reports no pain with palpation but states there is aching in his paraspinal muscles.   Neurological: He is alert and oriented to person, place, and time.  Skin: Skin is warm and dry. Capillary refill  takes less than 2 seconds.  Nursing note and vitals reviewed.    ED Treatments / Results  Labs (all labs ordered are listed, but only abnormal results are displayed) Labs Reviewed - No data to display  EKG None  Radiology No results found.  Procedures Procedures (including critical care time)  Medications Ordered in ED Medications - No data to display   Initial Impression / Assessment and Plan / ED Course  I have reviewed the triage vital signs and the nursing notes.  Pertinent labs & imaging results that were available during my care of the patient were reviewed by me and considered in my medical decision making (see chart for details).     Lower lumbar region x 1 1/2 week after increasing exercises activity. Patient states he has been increasing his exercise regimen from a sedentary lifestyle.Upon examination patient has pain along the paraspinal lower lumbar muscles. He has a negative straight leg test and he denies his pain radiating down his legs. He denies any fever, IV drug use or bowel or bladder symptoms.  Patient describes the pain as an ache, at this time I believe DG lumbar is not necessary as patient is describing muscular pain in his paraspinal muscles.  His pain presented right after patient increase his exercise activity.  States he never stretches prior or post exercise.  At this time I have talked to patient about the importance of stretching and strengthening his back with weight lifting not just cardio exercise.  Patient and mother at the bedside understand and agree with plan.  I have provided patient with muscle relaxers as he may have strained his back from exercising too much.  States he will not take the muscle relaxers unless he feels that the pain is intolerable.  She and agrees to begin stretching prior and post exercise.  Return precautions have been provided.  Patient's vitals remained stable throughout her ED visit, patient stable for discharge.  Final  Clinical Impressions(s) / ED Diagnoses   Final diagnoses:  Back pain without sciatica    ED Discharge Orders         Ordered    methocarbamol (ROBAXIN) 500 MG tablet  Daily     11/18/17 1936           Claude MangesSoto, Michaila Kenney, Cordelia Poche-C 11/18/17 1942    Arby BarrettePfeiffer, Marcy, MD 11/19/17 1325

## 2017-11-18 NOTE — Discharge Instructions (Signed)
I have prescribed medication for muscle spasm. Please take only when needed for pain, do not drink alcohol or drive while taking this medication as it can  make you drowsy. I have provided instructions on back exercises to strengthen your back . Please make sure to stretch prior and post exercise as this can help with your symptoms.

## 2017-12-04 ENCOUNTER — Emergency Department (HOSPITAL_COMMUNITY)
Admission: EM | Admit: 2017-12-04 | Discharge: 2017-12-04 | Disposition: A | Discharge status: Home / Self Care | Payer: No Typology Code available for payment source | Attending: Emergency Medicine | Admitting: Emergency Medicine

## 2017-12-04 ENCOUNTER — Encounter (HOSPITAL_COMMUNITY): Payer: Self-pay | Admitting: Emergency Medicine

## 2017-12-04 DIAGNOSIS — M25511 Pain in right shoulder: Secondary | ICD-10-CM | POA: Insufficient documentation

## 2017-12-04 DIAGNOSIS — Y92410 Unspecified street and highway as the place of occurrence of the external cause: Secondary | ICD-10-CM | POA: Diagnosis not present

## 2017-12-04 DIAGNOSIS — Z5321 Procedure and treatment not carried out due to patient leaving prior to being seen by health care provider: Secondary | ICD-10-CM | POA: Diagnosis not present

## 2017-12-04 DIAGNOSIS — M25512 Pain in left shoulder: Secondary | ICD-10-CM | POA: Diagnosis not present

## 2017-12-04 DIAGNOSIS — R51 Headache: Secondary | ICD-10-CM | POA: Insufficient documentation

## 2017-12-04 DIAGNOSIS — Y939 Activity, unspecified: Secondary | ICD-10-CM | POA: Diagnosis not present

## 2017-12-04 DIAGNOSIS — Y999 Unspecified external cause status: Secondary | ICD-10-CM | POA: Insufficient documentation
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
## 2017-12-05 ENCOUNTER — Emergency Department (HOSPITAL_BASED_OUTPATIENT_CLINIC_OR_DEPARTMENT_OTHER): Payer: No Typology Code available for payment source

## 2017-12-05 ENCOUNTER — Other Ambulatory Visit: Payer: Self-pay

## 2017-12-05 ENCOUNTER — Encounter (HOSPITAL_BASED_OUTPATIENT_CLINIC_OR_DEPARTMENT_OTHER): Payer: Self-pay | Admitting: *Deleted

## 2017-12-05 ENCOUNTER — Emergency Department (HOSPITAL_BASED_OUTPATIENT_CLINIC_OR_DEPARTMENT_OTHER)
Admission: EM | Admit: 2017-12-05 | Discharge: 2017-12-05 | Disposition: A | Discharge status: Home / Self Care | Payer: No Typology Code available for payment source | Attending: Emergency Medicine | Admitting: Emergency Medicine

## 2017-12-05 DIAGNOSIS — S199XXA Unspecified injury of neck, initial encounter: Secondary | ICD-10-CM | POA: Diagnosis not present

## 2017-12-05 DIAGNOSIS — Y999 Unspecified external cause status: Secondary | ICD-10-CM | POA: Diagnosis not present

## 2017-12-05 DIAGNOSIS — Y9389 Activity, other specified: Secondary | ICD-10-CM | POA: Diagnosis not present

## 2017-12-05 DIAGNOSIS — Y92410 Unspecified street and highway as the place of occurrence of the external cause: Secondary | ICD-10-CM | POA: Diagnosis not present

## 2017-12-05 DIAGNOSIS — Z79899 Other long term (current) drug therapy: Secondary | ICD-10-CM | POA: Diagnosis not present

## 2017-12-05 MED ORDER — METHOCARBAMOL 500 MG PO TABS
1000.0000 mg | ORAL_TABLET | Freq: Once | ORAL | Status: AC
  Administered 2017-12-05: 1000 mg via ORAL

## 2017-12-05 MED ORDER — NAPROXEN 250 MG PO TABS
ORAL_TABLET | ORAL | Status: AC
  Filled 2017-12-05: qty 2

## 2017-12-05 MED ORDER — METHOCARBAMOL 500 MG PO TABS: 500.0000 mg | ORAL_TABLET | Freq: Two times a day (BID) | ORAL | 0 refills | Status: DC

## 2017-12-05 MED ORDER — NAPROXEN 250 MG PO TABS
500.0000 mg | ORAL_TABLET | Freq: Once | ORAL | Status: AC
  Administered 2017-12-05: 500 mg via ORAL

## 2017-12-05 MED ORDER — METHOCARBAMOL 500 MG PO TABS
ORAL_TABLET | ORAL | Status: AC
  Filled 2017-12-05: qty 2

## 2017-12-05 MED ORDER — NAPROXEN 375 MG PO TABS: 375.0000 mg | ORAL_TABLET | Freq: Two times a day (BID) | ORAL | 0 refills | Status: DC
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
## 2017-12-05 NOTE — ED Notes (Signed)
Patient transported to X-ray 

## 2019-03-24 ENCOUNTER — Encounter (HOSPITAL_BASED_OUTPATIENT_CLINIC_OR_DEPARTMENT_OTHER): Payer: Self-pay | Admitting: *Deleted

## 2019-03-24 ENCOUNTER — Emergency Department (HOSPITAL_BASED_OUTPATIENT_CLINIC_OR_DEPARTMENT_OTHER)
Admission: EM | Admit: 2019-03-24 | Discharge: 2019-03-24 | Disposition: A | Discharge status: Home / Self Care | Payer: Self-pay | Attending: Emergency Medicine | Admitting: Emergency Medicine

## 2019-03-24 ENCOUNTER — Other Ambulatory Visit: Payer: Self-pay

## 2019-03-24 DIAGNOSIS — Z79899 Other long term (current) drug therapy: Secondary | ICD-10-CM | POA: Insufficient documentation

## 2019-03-24 DIAGNOSIS — M542 Cervicalgia: Secondary | ICD-10-CM | POA: Insufficient documentation

## 2019-03-24 DIAGNOSIS — R221 Localized swelling, mass and lump, neck: Secondary | ICD-10-CM | POA: Insufficient documentation

## 2019-03-24 MED ORDER — NAPROXEN 500 MG PO TABS: 500.0000 mg | ORAL_TABLET | Freq: Two times a day (BID) | ORAL | 0 refills | Status: DC

## 2019-03-24 NOTE — ED Provider Notes (Signed)
[facility] EMERGENCY DEPARTMENT Provider Note   CSN: [id number] Arrival date & time: 03/24/19  1721     History Mass  Austin Blanchard is a 28 y.o. male with a history of anxiety and obesity who presents to the emergency department with complaints of waxing and waning cystic type mass to the posterior left neck for the past few months.  Patient states that the area increased and decreased in size intermittently, no specific alleviating or rating factors, no intervention prior to arrival.  He states the area seemed more swollen over the past few days.  The area is somewhat uncomfortable at times.  He has not had any drainage or color changes that he is aware of.  Denies fever, chills, URI symptoms, cough, dyspnea, dental pain, nausea, or vomiting.  HPI     Past Medical History:  Diagnosis Date  . Anxiety   . Head pain     Patient Active Problem List   Diagnosis Date Noted  . Headache 05/12/2015  . OBESITY, NOS 05/31/2006  . 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 NO PAST SURGERIES    . PILONIDAL CYST DRAINAGE         Family History  Problem Relation Age of Onset  . Healthy Mother   . Healthy Father   . Asthma Brother   . Diabetes Maternal Grandmother   . Stroke Maternal Grandmother     Social History   Tobacco Use  . Smoking status: Never Smoker  . Smokeless tobacco: Never Used  Substance Use Topics  . Alcohol use: No  . Drug use: No    Home Medications Prior to Admission medications   Medication Sig Start Date End Date Taking? Authorizing Provider  b complex vitamins tablet Take 1 tablet by mouth daily.    [provider]  Cholecalciferol (VITAMIN D PO) Take 1 tablet by mouth daily.     [provider]  methocarbamol (ROBAXIN) 500 MG tablet Take 1 tablet (500 mg total) by mouth 2 (two) times daily. 12/05/17   Palumbo, April, MD  Multiple Vitamin (MULTIVITAMIN WITH MINERALS) TABS  tablet Take 1 tablet by mouth daily.    [provider]  naproxen (NAPROSYN) 375 MG tablet Take 1 tablet (375 mg total) by mouth 2 (two) times daily. 12/05/17   Palumbo, April, MD    Allergies    Patient has no known allergies.  Review of Systems   Review of Systems  Constitutional: Negative for chills and fever.  HENT: Negative for congestion, ear pain and sore throat.   Respiratory: Negative for cough and shortness of breath.   Gastrointestinal: Negative for abdominal pain, nausea and vomiting.  Skin:       Positive for mass to the posterior neck   Neurological: Negative for weakness and numbness.    Physical Exam Updated Vital Signs BP (!) 145/85 (BP Location: Right Arm)   Temp 99.1 F (37.3 C) (Oral)   Resp 18   Ht 6' (1.829 m)   Wt 122.5 kg   SpO2 100%   BMI 36.62 kg/m   Physical Exam Vitals and nursing note reviewed.  Constitutional:      General: He is not in acute distress.    Appearance: He is well-developed. He is not toxic-appearing.  HENT:     Head: Normocephalic and atraumatic.     Right Ear: Ear canal normal. Tympanic membrane is not perforated, erythematous, retracted or bulging.  Left Ear: Ear canal normal. Tympanic membrane is not perforated, erythematous, retracted or bulging.     Ears:     Comments: No mastoid erythema/swellng/tenderness.     Nose:     Right Sinus: No maxillary sinus tenderness or frontal sinus tenderness.     Left Sinus: No maxillary sinus tenderness or frontal sinus tenderness.     Mouth/Throat:     Pharynx: Oropharynx is clear. Uvula midline. No oropharyngeal exudate or posterior oropharyngeal erythema.     Comments: Posterior oropharynx is symmetric appearing. Patient tolerating own secretions without difficulty. No trismus. No drooling. No hot potato voice. No swelling beneath the tongue, submandibular compartment is soft.  Eyes:     General:        Right eye: No discharge.        Left eye: No discharge.      Conjunctiva/sclera: Conjunctivae normal.  Neck:     Comments: 2.5 cm diameter cystic mobile mass to the left posterior neck, no overlying erythema/warmth, no drainage.  Mildly tender to palpation.  Pictured below. Cardiovascular:     Rate and Rhythm: Normal rate and regular rhythm.  Pulmonary:     Effort: Pulmonary effort is normal. No respiratory distress.     Breath sounds: Normal breath sounds. No wheezing, rhonchi or rales.  Abdominal:     General: There is no distension.     Palpations: Abdomen is soft.     Tenderness: There is no abdominal tenderness.  Musculoskeletal:     Cervical back: Neck supple. No rigidity.  Lymphadenopathy:     Head:     Right side of head: No submental, submandibular, tonsillar, preauricular, posterior auricular or occipital adenopathy.     Left side of head: No submental, submandibular, tonsillar, preauricular, posterior auricular or occipital adenopathy.     Cervical: No cervical adenopathy.     Upper Body:     Right upper body: No supraclavicular adenopathy.     Left upper body: No supraclavicular adenopathy.  Skin:    General: Skin is warm and dry.     Findings: No rash.  Neurological:     Mental Status: He is alert.  Psychiatric:        Behavior: Behavior normal.       ED Results / Procedures / Treatments   Labs (all labs ordered are listed, but only abnormal results are displayed) Labs Reviewed - No data to display  EKG None  Radiology No results found.  Procedures Procedures (including critical care time)  Medications Ordered in ED Medications - No data to display  ED Course  I have reviewed the triage vital signs and the nursing notes.  Pertinent labs & imaging results that were available during my care of the patient were reviewed by me and considered in my medical decision making (see chart for details).    MDM Rules/Calculators/A&P                      Patient presents to the emergency department for waxing/waning  mass to the left posterior neck for the past few months.  Does have some discomfort with this.  Nontoxic-appearing, vitals WNL on my assessment, initial elevated BP resolved.  On exam he does have a 2.5 cm diameter somewhat firm cystic mobile mass to the posterior left neck.  There is no overlying erythema/warmth, does not can seem consistent with infectious process such as abscess or infected cyst currently.  Stuttering lipoma versus epidermoid cyst, unclear definitive  etiology.  Will trial NSAIDs with dermatology/general surgery follow-up. I discussed treatment plan, need for follow-up, and return precautions with the patient. Provided opportunity for questions, patient confirmed understanding and is in agreement with plan.   Final Clinical Impression(s) / ED Diagnoses Final diagnoses:  Mass in neck    Rx / DC Orders ED Discharge Orders         Ordered    naproxen (NAPROSYN) 500 MG tablet  2 times daily     03/24/19 2017           Amaryllis Dyke, PA-C 03/24/19 2019    Fredia Sorrow, MD 03/27/19 [phone]

## 2019-03-24 NOTE — Discharge Instructions (Addendum)
You were seen in the emergency department today for a mass to your neck, we suspect this may be a cyst, we are sending her with naproxen to help with pain.  - Naproxen is a nonsteroidal anti-inflammatory medication that will help with pain and swelling. Be sure to take this medication as prescribed with food, 1 pill every 12 hours,  It should be taken with food, as it can cause stomach upset, and more seriously, stomach bleeding. Do not take other nonsteroidal anti-inflammatory medications with this such as Advil, Motrin, Aleve, Mobic, Goodie Powder, or Motrin.    You make take Tylenol per over the counter dosing with these medications.   We have prescribed you new medication(s) today. Discuss the medications prescribed today with your pharmacist as they can have adverse effects and interactions with your other medicines including over the counter and prescribed medications. Seek medical evaluation if you start to experience new or abnormal symptoms after taking one of these medicines, seek care immediately if you start to experience difficulty breathing, feeling of your throat closing, facial swelling, or rash as these could be indications of a more serious allergic reaction  We would like you to follow-up with general surgery or with dermatology within 1 week for reevaluation.  Return to the ER for new or worsening symptoms including but not limited to increase in size, redness, drainage, fever, chills, or any other concerns.  [facility] Dermatologists:   [facility]  3.2 [phone])  Dermatologist  [place] [address] [place]  [phone]   Dr. Michelene Gardener, MD  2.6 [phone])  Dermatologist  [place]  [phone]  [facility]  3.5 (3)  [facility]  [place]  [phone]   [place]  4.0 (4)  Dermatologist  [place]  [phone]  Lavonna Monarch MD  3.0 (2)  Dermatologist  [place]  [phone]  Katrina Stack  2.7 (6)  Dermatologist  [place]  [phone]  [place] Amy Y MD  2.0 (1)  Dermatologist  [place]  [phone]  [place]  5.0 (3)  Doctor  [address]  [phone]

## 2019-03-24 NOTE — ED Triage Notes (Signed)
Pimple on neck comes and goes x 2 months, some pain to left side of neck  "swollen lymph node" x 2 days

## 2019-11-24 ENCOUNTER — Other Ambulatory Visit: Payer: Self-pay

## 2019-11-24 ENCOUNTER — Emergency Department (HOSPITAL_BASED_OUTPATIENT_CLINIC_OR_DEPARTMENT_OTHER)
Admission: EM | Admit: 2019-11-24 | Discharge: 2019-11-24 | Disposition: A | Discharge status: Home / Self Care | Payer: Self-pay | Attending: Emergency Medicine | Admitting: Emergency Medicine

## 2019-11-24 ENCOUNTER — Encounter (HOSPITAL_BASED_OUTPATIENT_CLINIC_OR_DEPARTMENT_OTHER): Payer: Self-pay | Admitting: *Deleted

## 2019-11-24 DIAGNOSIS — M25512 Pain in left shoulder: Secondary | ICD-10-CM | POA: Insufficient documentation

## 2019-11-24 DIAGNOSIS — Z79899 Other long term (current) drug therapy: Secondary | ICD-10-CM | POA: Insufficient documentation

## 2019-11-24 MED ORDER — KETOROLAC TROMETHAMINE 15 MG/ML IJ SOLN
15.0000 mg | Freq: Once | INTRAMUSCULAR | Status: AC
  Administered 2019-11-24: 15 mg via INTRAMUSCULAR
  Filled 2019-11-24: qty 1

## 2019-11-24 MED ORDER — METHOCARBAMOL 500 MG PO TABS: 500.0000 mg | ORAL_TABLET | Freq: Two times a day (BID) | ORAL | 0 refills | Status: DC

## 2019-11-24 MED ORDER — HYDROCODONE-ACETAMINOPHEN 5-325 MG PO TABS
1.0000 | ORAL_TABLET | Freq: Once | ORAL | Status: AC
  Administered 2019-11-24: 1 via ORAL
  Filled 2019-11-24: qty 1

## 2019-11-24 MED ORDER — LIDOCAINE 5 % EX PTCH
1.0000 | MEDICATED_PATCH | Freq: Once | CUTANEOUS | Status: DC
  Administered 2019-11-24: 1 via TRANSDERMAL
  Filled 2019-11-24: qty 1

## 2019-11-24 MED ORDER — PREDNISONE 10 MG (21) PO TBPK: ORAL_TABLET | Freq: Every day | ORAL | 0 refills | Status: DC

## 2019-11-24 NOTE — Discharge Instructions (Addendum)
Prescription sent to your pharmacy for a steroid taper.  This is the prednisone.  Take as prescribed.  Prescription also sent for Robaxin.  This a muscle relaxer can make you drowsy.  Do not take this if you are going to be driving or working.  Continue taking ibuprofen starting back tomorrow.  The next 1 dose she should be taking is 600 mg.  Take as directed on the bottle which is 4 times per day.  Take it with food so does not cause an upset stomach.  It is likely this is a pulled muscle or pinched nerve.  The medication should help with inflammation which will help your pain.  You can also try icing or applying heat to your shoulder if that helps.  Symptoms should be improving within 7 to 10 days if you continue symptomatic treatment.   [facility] as we discussed.  Call to try to establish care

## 2019-11-24 NOTE — ED Provider Notes (Signed)
[facility] EMERGENCY DEPARTMENT Provider Note   CSN: [id number] Arrival date & time: 11/24/19  1653     History Chief Complaint  Patient presents with   Shoulder Pain    Austin Blanchard is a 29 y.o. right-hand-dominant male with past medical history significant for anxiety.  HPI Patient presents to emergency department today with chief complaint of progressively worsening left shoulder pain x2 days.  He states he noticed the pain when he first woke up yesterday.  He states the pain is located on the back of his left shoulder and radiates to his elbow.  He states the pain is sharp.  The pain is constant.  He rates the pain 8 out of 10 in severity.  Pain is not worse with movement.  He has been unable to find a position that he get comfortable in.  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  He tried taking ibuprofen yesterday without any symptom improvement.  He states this feels similar to when he had a pulled muscle in his neck in the past.  Denies any fever, chills, cough, chest pain, numbness, weakness, decrease sensation.  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 Authorizing Provider  b complex vitamins tablet Take 1 tablet by mouth daily.   Yes [provider]  Cholecalciferol (VITAMIN D PO) Take 1 tablet by mouth daily.    Yes [provider]  Multiple Vitamin (MULTIVITAMIN WITH MINERALS) TABS tablet Take 1 tablet by mouth daily.   Yes [provider]  methocarbamol (ROBAXIN) 500 MG tablet Take 1 tablet (500 mg total) by mouth 2 (two) times daily. 11/24/19   Chalyn Amescua E, PA-C  naproxen (NAPROSYN) 500 MG tablet Take 1 tablet (500 mg total) by mouth 2 (two) times daily. 03/24/19   Petrucelli, Samantha R, PA-C  predniSONE (STERAPRED UNI-PAK 21 TAB) 10 MG (21) TBPK tablet Take by mouth daily. Take 6 tabs by mouth daily  for 2 days, then 5 tabs for 2 days, then 4 tabs for 2 days, then 3 tabs for 2 days, 2 tabs for 2 days, then 1 tab by mouth daily for 2 days 11/24/19   Stephone Gum, Yvonna Alanis E, PA-C    Allergies    Patient has no known allergies.  Review of Systems   Review of Systems All other systems are reviewed and are negative for acute change except as noted in the HPI.  Physical Exam Updated Vital Signs BP 123/85 (BP Location: Right  Arm)    Pulse 88    Temp 98.5 F (36.9 C)    Resp 18    Ht 6' (1.829 m)    Wt 108.9 kg    SpO2 98%    BMI 32.55 kg/m   Physical Exam Vitals and nursing note reviewed.  Constitutional:      Appearance: He is well-developed. He is not ill-appearing or toxic-appearing.  HENT:     Head: Normocephalic and atraumatic.     Nose: Nose normal.  Eyes:     General: No scleral icterus.       Right eye: No discharge.        Left eye: No discharge.     Conjunctiva/sclera: Conjunctivae normal.  Neck:     Vascular: No JVD.     Comments: Full ROM intact without spinous process TTP. No bony stepoffs or deformities, left cervical paraspinous muscle TTP, no muscle spasms. No rigidity or meningeal signs. No bruising, erythema, or swelling.  Cardiovascular:     Rate and  Rhythm: Normal rate and regular rhythm.     Pulses: Normal pulses.     Heart sounds: Normal heart sounds.  Pulmonary:     Effort: Pulmonary effort is normal.     Breath sounds: Normal breath sounds.  Abdominal:     General: There is no distension.  Musculoskeletal:        General: Normal range of motion.     Cervical back: Normal range of motion.     Comments: Left shoulder without tenderness to palpation. Full ROM. Negative  empty can test, negative  Neer's, negative Lift off. No swelling, erythema or ecchymosis present. No step-off, crepitus, or deformity appreciated. 5/5 muscle strength of bilateral UE. 2+ radial pulse, sensation intact and all compartments soft.   Skin:    General: Skin is warm and dry.  Neurological:     Mental Status: He is oriented to person, place, and time.     GCS: GCS eye subscore is 4. GCS verbal subscore is 5. GCS motor subscore is 6.     Comments: Fluent speech, no facial droop.  Psychiatric:        Behavior: Behavior normal.     ED Results / Procedures / Treatments   Labs (all labs ordered are listed, but only abnormal results are displayed) Labs Reviewed - No data to display  EKG None  Radiology No results found.  Procedures Procedures (including critical care time)  Medications Ordered in ED Medications  lidocaine (LIDODERM) 5 % 1 patch (1 patch Transdermal Patch Applied 11/24/19 1829)  HYDROcodone-acetaminophen (NORCO/VICODIN) 5-325 MG per tablet 1 tablet (1 tablet Oral Given 11/24/19 1828)  ketorolac (TORADOL) 15 MG/ML injection 15 mg (15 mg Intramuscular Given 11/24/19 1832)    ED Course  I have reviewed the triage vital signs and the nursing notes.  Pertinent labs & imaging results that were available during my care of the patient were reviewed by me and considered in my medical decision making (see chart for details).    MDM Rules/Calculators/A&P                          History provided by patient with additional history  obtained from chart review.    29 year old male presenting with left shoulder pain.  Patient is well-appearing.  No acute distress.  Vital signs are stable.  On exam he has tenderness to palpation of left scapula.  No worsening pain with range of  motion.  Compartments in left upper extremity are soft.  His exam is suggestive of MSK strain vs cervical radiculopathy.  Patient denies history of any kidney problems.  Blood work from 5 years ago with normal kidney function.  Presentation not assistant with ACS, no emergent work-up needed. Patient given dose of IM Toradol, dose of pain medicine and lidocaine patch.  Will discharge home with steroid taper and muscle laxer.  Advised not to drive or work while taking muscle relaxer.  The patient appears reasonably screened and/or stabilized for discharge and I doubt any other medical condition or other [facility] requiring further screening, evaluation, or treatment in the ED at this time prior to discharge. The patient is safe for discharge with strict return precautions discussed. Recommend pcp follow up if pain persists. Given information for [facility] health community clinic.   Portions of this note were generated with Scientist, clinical (histocompatibility and immunogenetics). Dictation errors may occur despite best attempts at proofreading.   Final Clinical Impression(s) / ED Diagnoses Final diagnoses:  Acute pain of left shoulder    Rx / DC Orders ED Discharge Orders         Ordered    predniSONE (STERAPRED UNI-PAK 21 TAB) 10 MG (21) TBPK tablet  Daily        11/24/19 1820    methocarbamol (ROBAXIN) 500 MG tablet  2 times daily        11/24/19 1820           Kathyrn Lass 11/24/19 1900    Milagros Loll, MD 11/25/19 1622

## 2019-11-24 NOTE — ED Triage Notes (Signed)
Left shoulder pain since yesterday. No injury. He feels like he has a pinched nerve.

## 2019-11-27 ENCOUNTER — Encounter (HOSPITAL_COMMUNITY): Payer: Self-pay | Admitting: *Deleted

## 2019-11-27 ENCOUNTER — Emergency Department (HOSPITAL_COMMUNITY)
Admission: EM | Admit: 2019-11-27 | Discharge: 2019-11-27 | Disposition: A | Discharge status: Home / Self Care | Payer: Self-pay | Attending: Emergency Medicine | Admitting: Emergency Medicine

## 2019-11-27 ENCOUNTER — Other Ambulatory Visit: Payer: Self-pay

## 2019-11-27 DIAGNOSIS — Z5321 Procedure and treatment not carried out due to patient leaving prior to being seen by health care provider: Secondary | ICD-10-CM | POA: Insufficient documentation

## 2019-11-27 DIAGNOSIS — M542 Cervicalgia: Secondary | ICD-10-CM | POA: Insufficient documentation

## 2019-11-27 NOTE — ED Notes (Signed)
3rd and final call for pt with no response.

## 2019-11-27 NOTE — ED Triage Notes (Signed)
Pt states pain in the left neck that radiates through the left shoulder, says he woke up with the pain on Sunday morning. Pain worse to repositioning.

## 2019-11-27 NOTE — ED Notes (Signed)
Called for room x 1.  

## 2019-12-07 ENCOUNTER — Encounter (HOSPITAL_BASED_OUTPATIENT_CLINIC_OR_DEPARTMENT_OTHER): Payer: Self-pay | Admitting: Emergency Medicine

## 2019-12-07 ENCOUNTER — Emergency Department (HOSPITAL_BASED_OUTPATIENT_CLINIC_OR_DEPARTMENT_OTHER)
Admission: EM | Admit: 2019-12-07 | Discharge: 2019-12-07 | Disposition: A | Discharge status: Home / Self Care | Payer: Self-pay | Attending: Emergency Medicine | Admitting: Emergency Medicine

## 2019-12-07 ENCOUNTER — Other Ambulatory Visit: Payer: Self-pay

## 2019-12-07 DIAGNOSIS — M25522 Pain in left elbow: Secondary | ICD-10-CM | POA: Insufficient documentation

## 2019-12-07 DIAGNOSIS — M25512 Pain in left shoulder: Secondary | ICD-10-CM | POA: Insufficient documentation

## 2019-12-07 DIAGNOSIS — Z79899 Other long term (current) drug therapy: Secondary | ICD-10-CM | POA: Insufficient documentation

## 2019-12-07 NOTE — ED Notes (Addendum)
Pt requests getting MRI ordered, endorses treatment with pain meds and ant-iinflammatory meds with no improvement

## 2019-12-07 NOTE — ED Triage Notes (Signed)
Pain in neck and right shoulder x 2 weeks, 2nd time to [facility], has been to doctors and urgent care, last night heard pop in right arm, now c/o neck pain, pt requesting MRI

## 2019-12-07 NOTE — Discharge Instructions (Addendum)
Follow up with Dr. Jordan Likes the sports medicine doctor as we discussed. Call the office first thing Monday and ask for the next available ER follow-up appointment.  You can continue to try heat and ice if that helps your pain.  Also try Tylenol and ibuprofen if needed.  Thank you for allowing [place] to be part of your care.

## 2019-12-07 NOTE — ED Provider Notes (Signed)
[facility] EMERGENCY DEPARTMENT Provider Note   CSN: [id number] Arrival date & time: 12/07/19  1342     History Chief Complaint  Patient presents with  . Shoulder Pain  . Neck Pain    Austin Blanchard is a 29 y.o. right hand dominant male with past medical history significant for anxiety.  HPI Presents to emergency department today with chief complaint of left shoulder pain x2 weeks.  Triage note has pain with documented as right sided however I confirmed with patient the pain is on the left.  Patient has been seen in the ED as well as urgent care without any symptom relief he states.  He has been prescribed a steroid taper, muscle laxer, narcotic pain medicine, anti-inflammatories.  He states his pain had improved slightly after finishing the narcotic however last night he was laying in bed and rolled over and heard a loud pop.  His severe pain then returned.  The same pain he has been having.  Pain is located in his left shoulder and radiates to his elbow.  He states the pain is sharp.  Pain has been constant since last night.  Rates the pain 8 out of 10 in severity.  He denies any fever, chills, numbness, tingling, chest pain, shortness of breath or difficulty breathing, weakness.    I personally saw patient in the ED on 11/24/2019 for the same pain.  Seen at urgent care on 8/26/121 and had x-ray of his cervical spine that was negative.  Was recommended he follow-up with neurosurgery or orthopedics for possibility of needing an MRI.    Past Medical History:  Diagnosis Date  . Anxiety   . Head pain     Patient Active Problem List   Diagnosis Date Noted  . Headache 05/12/2015  . OBESITY, NOS 05/31/2006  . 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 NO PAST SURGERIES    . PILONIDAL CYST DRAINAGE         Family History  Problem Relation Age of Onset  . Healthy Mother   . Healthy Father   . Asthma Brother   . Diabetes  Maternal Grandmother   . Stroke Maternal Grandmother     Social History   Tobacco Use  . Smoking status: Never Smoker  . Smokeless tobacco: Never Used  Substance Use Topics  . Alcohol use: No  . Drug use: No    Home Medications Prior to Admission medications   Medication Sig Start Date End Date Taking? Authorizing Provider  b complex vitamins tablet Take 1 tablet by mouth daily.    [provider]  Cholecalciferol (VITAMIN D PO) Take 1 tablet by mouth daily.     [provider]  methocarbamol (ROBAXIN) 500 MG tablet Take 1 tablet (500 mg total) by mouth 2 (two) times daily. 11/24/19   Natha Guin E, PA-C  Multiple Vitamin (MULTIVITAMIN WITH MINERALS) TABS tablet Take 1 tablet by mouth daily.    [provider]  naproxen (NAPROSYN) 500 MG tablet Take 1 tablet (500 mg total) by mouth 2 (two) times daily. 03/24/19   Petrucelli, Samantha R, PA-C  predniSONE (STERAPRED UNI-PAK 21 TAB) 10 MG (21) TBPK tablet Take by mouth daily. Take 6 tabs by mouth daily  for 2 days, then 5 tabs for 2 days, then 4 tabs for 2 days, then 3 tabs for 2 days, 2 tabs for 2 days, then 1 tab by mouth daily for 2 days  11/24/19   Farris Blash, Caroleen Hamman, PA-C    Allergies    Patient has no known allergies.  Review of Systems   Review of Systems All other systems are reviewed and are negative for acute change except as noted in the HPI.  Physical Exam Updated Vital Signs BP (!) 150/94 (BP Location: Left Arm)   Pulse 91   Temp 98.8 F (37.1 C) (Oral)   Resp 18   Ht 6' (1.829 m)   Wt 112 kg   SpO2 98%   BMI 33.50 kg/m   Physical Exam Vitals and nursing note reviewed.  Constitutional:      General: He is not in acute distress.    Appearance: He is not ill-appearing.  HENT:     Head: Normocephalic and atraumatic.     Right Ear: Tympanic membrane and external ear normal.     Left Ear: Tympanic membrane and external ear normal.     Nose: Nose normal.     Mouth/Throat:      Mouth: Mucous membranes are moist.     Pharynx: Oropharynx is clear.  Eyes:     General: No scleral icterus.       Right eye: No discharge.        Left eye: No discharge.     Extraocular Movements: Extraocular movements intact.     Conjunctiva/sclera: Conjunctivae normal.     Pupils: Pupils are equal, round, and reactive to light.  Neck:     Vascular: No JVD.     Comments: Full ROM intact without spinous process TTP. No bony stepoffs or deformities, left paraspinous muscle TTP or without muscle spasms. No rigidity or meningeal signs. No bruising, erythema, or swelling.  Cardiovascular:     Rate and Rhythm: Normal rate and regular rhythm.     Pulses: Normal pulses.          Radial pulses are 2+ on the right side and 2+ on the left side.     Heart sounds: Normal heart sounds.  Pulmonary:     Comments: Lungs clear to auscultation in all fields. Symmetric chest rise. No wheezing, rales, or rhonchi. Abdominal:     Comments: Abdomen is soft, non-distended, and non-tender in all quadrants. No rigidity, no guarding. No peritoneal signs.  Musculoskeletal:        General: Normal range of motion.     Cervical back: Normal range of motion.     Comments: Left shoulder without tenderness to palpation. Full ROM. Negative empty can test, negative Neer's, negative Lift off. No swelling, erythema or ecchymosis present. No step-off, crepitus, or deformity appreciated. 5/5 muscle strength of bilateral UE. 2+ radial pulse, sensation intact and all compartments soft.   Skin:    General: Skin is warm and dry.     Capillary Refill: Capillary refill takes less than 2 seconds.  Neurological:     Mental Status: He is oriented to person, place, and time.     GCS: GCS eye subscore is 4. GCS verbal subscore is 5. GCS motor subscore is 6.     Comments: Fluent speech, no facial droop.  Psychiatric:        Behavior: Behavior normal.     ED Results / Procedures / Treatments   Labs (all labs ordered are  listed, but only abnormal results are displayed) Labs Reviewed - No data to display  EKG None  Radiology No results found.  Procedures Procedures (including critical care time)  Medications Ordered in ED Medications -  No data to display  ED Course  I have reviewed the triage vital signs and the nursing notes.  Pertinent labs & imaging results that were available during my care of the patient were reviewed by me and considered in my medical decision making (see chart for details).    MDM Rules/Calculators/A&P                          History provided by patient with additional history obtained from chart review.    Patient seen and examined. Patient presents awake, alert, hemodynamically stable, afebrile, non toxic. Patient presenting with left shoulder pain.  Exam is unremarkable.  Left upper extremity is neurovascularly intact.  Compartments are soft.  Negative special testing.  I viewed his imaging of cervical spine performed at urgent care.  No abnormalities seen.  Patient is requesting an MRI.  At this time there is no MRI on site.  There are also no emergent indication for an MRI right now.  Will have patient follow-up with sports medicine for further outpatient testing.  Lengthy discussion with patient regarding possible treatment including anti-inflammatories or steroids.  He denies any need for new prescriptions today.  He states he is only here for a referral.  Encouraged symptomatic care at home.  The patient appears reasonably screened and/or stabilized for discharge and I doubt any other medical condition or other [facility] requiring further screening, evaluation, or treatment in the ED at this time prior to discharge. The patient is safe for discharge with strict return precautions discussed.    Portions of this note were generated with Scientist, clinical (histocompatibility and immunogenetics). Dictation errors may occur despite best attempts at proofreading.    Final Clinical Impression(s) / ED  Diagnoses Final diagnoses:  Acute pain of left shoulder    Rx / DC Orders ED Discharge Orders    None       Sherene Sires, PA-C 12/07/19 1835    Vanetta Mulders, MD 12/18/19 1059

## 2019-12-10 ENCOUNTER — Encounter: Payer: Self-pay | Admitting: Family Medicine

## 2019-12-10 ENCOUNTER — Ambulatory Visit (INDEPENDENT_AMBULATORY_CARE_PROVIDER_SITE_OTHER): Payer: Self-pay | Admitting: Family Medicine

## 2019-12-10 ENCOUNTER — Other Ambulatory Visit: Payer: Self-pay

## 2019-12-10 VITALS — BP 113/77 | Ht 72.0 in | Wt 247.0 lb

## 2019-12-10 DIAGNOSIS — M5412 Radiculopathy, cervical region: Secondary | ICD-10-CM

## 2019-12-10 MED ORDER — KETOROLAC TROMETHAMINE 30 MG/ML IJ SOLN
30.0000 mg | Freq: Once | INTRAMUSCULAR | Status: AC
  Administered 2019-12-10: 30 mg via INTRAMUSCULAR

## 2019-12-10 MED ORDER — GABAPENTIN 100 MG PO CAPS: 100.0000 mg | ORAL_CAPSULE | Freq: Three times a day (TID) | ORAL | 1 refills | Status: DC

## 2019-12-10 MED ORDER — HYDROCODONE-ACETAMINOPHEN 5-325 MG PO TABS: 1.0000 | ORAL_TABLET | Freq: Three times a day (TID) | ORAL | 0 refills | Status: DC | PRN

## 2019-12-10 NOTE — Patient Instructions (Signed)
Nice to meet you Please try heat  Please try the exercises  Physical therapy will give you call  Stat with one gabapentin at night. This can make you sleepy. You can increase to 2 or 3 times daily as you tolerate  Please use the pain medicine as needed.   Please send me a message in MyChart with any questions or updates.  Please see me back in 4weeks.   --Dr. Jordan Likes

## 2019-12-10 NOTE — Assessment & Plan Note (Signed)
Pain seems more related to radiculopathy as opposed to being from the shoulder. -Counseled on home exercise therapy and supportive care. -Intramuscular Toradol. -Norco. -Gabapentin. -Referral to physical therapy. -Could consider trigger point injections.

## 2019-12-10 NOTE — Progress Notes (Signed)
Austin Blanchard - 29 y.o. male MRN [id number]  Date of birth: 30-Apr-1990  SUBJECTIVE:  Including CC & ROS.  Chief Complaint  Patient presents with  . Shoulder Pain    left x 3 weeks    Austin Blanchard is a 29 y.o. male that is presenting with left neck and arm pain.  Pain has been ongoing for 3 weeks.  He has tried steroids as well as anti-inflammatories.  He does get some reprieve with a pain medication.  Denies any inciting event or trauma.  No history of surgery.  No history of similar pain.   Review of Systems See HPI   HISTORY: Past Medical, Surgical, Social, and Family History Reviewed & Updated per EMR.   Pertinent Historical Findings include:  Past Medical History:  Diagnosis Date  . Anxiety   . Head pain     Past Surgical History:  Procedure Laterality Date  . NO PAST SURGERIES    . PILONIDAL CYST DRAINAGE      Family History  Problem Relation Age of Onset  . Healthy Mother   . Healthy Father   . Asthma Brother   . Diabetes Maternal Grandmother   . Stroke Maternal Grandmother     Social History   Socioeconomic History  . Marital status: Single    Spouse name: Not on file  . Number of children: 0  . Years of education: 10  . Highest education level: Not on file  Occupational History  . Occupation: Unemployed  Tobacco Use  . Smoking status: Never Smoker  . Smokeless tobacco: Never Used  Substance and Sexual Activity  . Alcohol use: No  . Drug use: No  . Sexual activity: Not on file  Other Topics Concern  . Not on file  Social History Narrative   Lives at home with his mother.   Right-handed.   No caffeine use.   Social Determinants of Health   Financial Resource Strain:   . Difficulty of Paying Living Expenses: Not on file  Food Insecurity:   . Worried About Programme researcher, broadcasting/film/video in the Last Year: Not on file  . Ran Out of Food in the Last Year: Not on file  Transportation Needs:   . Lack of Transportation (Medical): Not on file  .  Lack of Transportation (Non-Medical): Not on file  Physical Activity:   . Days of Exercise per Week: Not on file  . Minutes of Exercise per Session: Not on file  Stress:   . Feeling of Stress : Not on file  Social Connections:   . Frequency of Communication with Friends and Family: Not on file  . Frequency of Social Gatherings with Friends and Family: Not on file  . Attends Religious Services: Not on file  . Active Member of Clubs or Organizations: Not on file  . Attends Banker Meetings: Not on file  . Marital Status: Not on file  Intimate Partner Violence:   . Fear of Current or Ex-Partner: Not on file  . Emotionally Abused: Not on file  . Physically Abused: Not on file  . Sexually Abused: Not on file     PHYSICAL EXAM:  VS: BP 113/77   Ht 6' (1.829 m)   Wt 247 lb (112 kg)   BMI 33.50 kg/m  Physical Exam Gen: NAD, alert, cooperative with exam, well-appearing MSK:  Neck/left shoulder: Normal grip strength. No signs of atrophy. Normal shoulder range of motion. Normal neck flexion and extension. Normal strength  resistance with shrug. No winging of the scapula. Neurovascularly intact     ASSESSMENT & PLAN:   Cervical radiculopathy Pain seems more related to radiculopathy as opposed to being from the shoulder. -Counseled on home exercise therapy and supportive care. -Intramuscular Toradol. -Norco. -Gabapentin. -Referral to physical therapy. -Could consider trigger point injections.

## 2019-12-18 ENCOUNTER — Ambulatory Visit: Payer: Self-pay | Attending: Family Medicine | Admitting: Physical Therapy

## 2019-12-18 ENCOUNTER — Other Ambulatory Visit: Payer: Self-pay

## 2019-12-18 ENCOUNTER — Encounter: Payer: Self-pay | Admitting: Physical Therapy

## 2019-12-18 DIAGNOSIS — M5412 Radiculopathy, cervical region: Secondary | ICD-10-CM

## 2019-12-18 DIAGNOSIS — M62838 Other muscle spasm: Secondary | ICD-10-CM

## 2019-12-18 DIAGNOSIS — M6281 Muscle weakness (generalized): Secondary | ICD-10-CM

## 2019-12-18 DIAGNOSIS — R293 Abnormal posture: Secondary | ICD-10-CM

## 2019-12-18 DIAGNOSIS — M542 Cervicalgia: Secondary | ICD-10-CM

## 2019-12-18 NOTE — Therapy (Signed)
[facility] [place] [address]  [address] [place], [place], [address] Phone: [phone]   Fax:  [phone]  Physical Therapy Evaluation  Patient Details  Name: Austin FritzMichael J Blanchard MRN: [id number] Date of Birth: [birth year] Referring Provider (PT): Clare GandyJeremy Schmitz, MD   Encounter Date: 12/18/2019   PT End of Session - 12/18/19 1446    Visit Number 1    Number of Visits 8    Date for PT Re-Evaluation 02/12/20    Authorization Type Self-pay    PT Start Time 1446    PT Stop Time 1537    PT Time Calculation (min) 51 min    Activity Tolerance Patient tolerated treatment well    Behavior [facility] for tasks assessed/performed           Past Medical History:  Diagnosis Date  . Anxiety   . Head pain     Past Surgical History:  Procedure Laterality Date  . NO PAST SURGERIES    . PILONIDAL CYST DRAINAGE      There were no vitals filed for this visit.    Subjective Assessment - 12/18/19 1448    Subjective Pt reports he woke on 11/23/19 with severe pain in neck, L shoulder and arm. Has been unable to get any relief with OTC or Rx pain meds, thermal modalities, massage or stretching. Notes burning sensation down L arm to hand which has lessened and denies numbness or tingling but feels like he lost strength in L arm.    Limitations Sitting    How long can you sit comfortably? <1 hr    How long can you stand comfortably? 2-3 hrs    How long can you walk comfortably? 2-3 hrs    Diagnostic tests Cervical x-ray 12/06/19: Negative cervical spine radiographs.    Patient Stated Goals "want the pain to go away"    Currently in Pain? Yes    Pain Score 8     Pain Location Neck    Pain Orientation Left    Pain Descriptors / Indicators Burning;Aching;Stabbing    Pain Type Acute pain;Intractable pain    Pain Radiating Towards constant into L upper shoulder and down arm into hand    Pain Onset 1 to 4 weeks ago    Pain Frequency  Constant    Aggravating Factors  sitting worsening with increased time, esp when "hunched over computer"    Pain Relieving Factors nothing    Effect of Pain on Daily Activities poor tolerance for sitting at computer as a student; prevents him from doing his hobbies (due to limited sitting tolerance); interferes with sleep; unable to lift              [facility] - 12/18/19 1446      Assessment   Medical Diagnosis Cervical radiculopathy    Referring Provider (PT) Clare GandyJeremy Schmitz, MD    Onset Date/Surgical Date 11/23/19    Hand Dominance Right    Next MD Visit 01/12/20    Prior Therapy none      Precautions   Precautions None      Restrictions   Weight Bearing Restrictions No      Balance Screen   Has the patient fallen in the past 6 months No    Has the patient had a decrease in activity level because of a fear of falling?  No    Is the patient reluctant to leave their home because of a fear of  falling?  No      Home Tourist information centre manager residence      Prior Function   Level of Independence Independent    Vocation Student    Leisure play video games, reading, watch TV, exercising 3x/wk for wt loss prior to pain onset      Cognition   Overall Cognitive Status Within Functional Limits for tasks assessed      Posture/Postural Control   Posture/Postural Control Postural limitations    Postural Limitations Forward head;Rounded Shoulders;Increased thoracic kyphosis    Posture Comments pt reports he sleeps on 3 pillows at home      ROM / Strength   AROM / PROM / Strength AROM;Strength      AROM   AROM Assessment Site Cervical;Shoulder    Right/Left Shoulder --   B WFL/WNL but limited end ROM on L   Cervical Flexion 54 - pain & pulling    Cervical Extension 40 - pain & tension    Cervical - Right Side Bend 40    Cervical - Left Side Bend 39    Cervical - Right Rotation 69 - more painful    Cervical - Left Rotation 70      Strength    Strength Assessment Site Shoulder;Hand    Right/Left Shoulder Right;Left    Right Shoulder Flexion 5/5    Right Shoulder ABduction 5/5    Right Shoulder Internal Rotation 5/5    Right Shoulder External Rotation 5/5    Left Shoulder Flexion 4-/5    Left Shoulder ABduction 4/5    Left Shoulder Internal Rotation 4+/5    Left Shoulder External Rotation 4/5    Right/Left hand Right;Left    Right Hand Grip (lbs) 55   55, 54, 56   Left Hand Grip (lbs) 40   40, 42, 38     Palpation   Palpation comment ttp with increased muscle tension over L>R UT and LS      Special Tests    Special Tests Cervical    Cervical Tests Dictraction      Distraction Test   Comment pt reporting lessening of pain & L UE radiculopathy                      Objective measurements completed on examination: See above findings.       [facility] Adult PT Treatment/Exercise - 12/18/19 1446      Self-Care   Self-Care Posture    Posture Provided preliminary postural education for sleeping position, encouraging pt to work on gradually weaning from 3 pillows to 1 to allow for neutral spine alignment while sleeping as well as awareness of neutral sitting posture especially while sitting at computer for schoolwork and playing video games.      Exercises   Exercises Neck      Neck Exercises: Seated   Neck Retraction 5 reps;5 secs    Other Seated Exercise B scapular retraction & depression      Neck Exercises: Supine   Neck Retraction 5 reps;5 secs    Neck Retraction Limitations into PT's hand with PT guiding motion for 1st 2 reps      Manual Therapy   Manual Therapy Soft tissue mobilization;Myofascial release;Other (comment)    Soft tissue mobilization STM/DTM to L UT & LS    Myofascial Release manual TPR to L UT    Other Manual Therapy Instructed pt in use of Theracane for self TPR for upper shoulder.  Neck Exercises: Stretches   Upper Trapezius Stretch Left;30 seconds;1 rep    Levator Stretch  Left;30 seconds;1 rep    Chest Stretch 2 reps    Chest Stretch Limitations supine "snow angels"; also provided brief demonstration of 3--way doorway pec stretch                  PT Education - 12/18/19 1535    Education Details PT eval findings, anticipated POC, prelininary postural education & initial HEP    Person(s) Educated Patient    Methods Explanation;Demonstration;Verbal cues;Tactile cues;Handout    Comprehension Verbalized understanding;Verbal cues required;Tactile cues required;Need further instruction               PT Long Term Goals - 12/18/19 1537      PT LONG TERM GOAL #1   Title Patient will be independent with ongoing/advanced HEP    Status New    Target Date 02/12/20      PT LONG TERM GOAL #2   Title Patient to demonstrate ability to achieve and maintain good spinal alignment/posturing    Status New    Target Date 02/12/20      PT LONG TERM GOAL #3   Title Patient to report pain reduction in frequency and intensity by >/= 50%    Status New    Target Date 02/12/20      PT LONG TERM GOAL #4   Title Patient to improve cervical AROM to WFL/WNL without pain provocation    Status New    Target Date 02/12/20      PT LONG TERM GOAL #5   Title Patient will demonstrate improved L shoulder strength to >/= 4+/5 and grip strength to within 10# of R hand for functional UE use    Status New    Target Date 02/12/20      PT LONG TERM GOAL #6   Title Patient will report no sleep disturbance due to pain    Status New    Target Date 02/12/20      PT LONG TERM GOAL #7   Title Patient to report ability to perform schoolwork and daily activities including lifting without pain provocation    Status New    Target Date 02/12/20                  Plan - 12/18/19 1537    Clinical Impression Statement Austin Blanchard is a 29 y/o male who presents with acute onset L sided neck pain and L UE cervical radiculopathy upon waking from sleep on 11/23/19. He denies known  injury or trauma to neck. Pain includes a burning sensation down L arm and L UE weakness but denies numbness or tingling in L UE. He reports he has been unable to get any relief from anything he has tried so far including meds, thermal modalities, or massage. Deficits include severely forward head and rounded shoulders with increased thoracic kyphosis (pt spends a lot of time in front of a computer doing schoolwork and playing video games), mildly limited cervical ROM, increased muscle tension and ttp in L cervical paraspinals, UT and LS, and mild L UE weakness and decreased grip strength. Pain interferes with sleep, limits tolerance for sitting to do schoolwork at computer and prevents him from lifting. Austin Blanchard will benefit from skilled PT to address above deficits, promote neutral spinal posture and restore functional cervical ROM with decreased pain and muscle tightness to restore improved quality of life with decreased pain interference.    Personal  Factors and Comorbidities Time since onset of injury/illness/exacerbation;Comorbidity 3+    Comorbidities headaches, anxiety, obesity    Examination-Activity Limitations Lift;Carry;Sleep;Sit;Stand;Locomotion Level    Examination-Participation Restrictions School;Other   hobbies/recreation   Stability/Clinical Decision Making Stable/Uncomplicated    Clinical Decision Making Low    Rehab Potential Good    PT Frequency 1x / week   Pt wishing to try biweekly to start given self-pay but would like option to come 1x/wk if needed   PT Duration 8 weeks    PT Treatment/Interventions ADLs/Self Care Home Management;Cryotherapy;Electrical Stimulation;Iontophoresis 4mg /ml Dexamethasone;Moist Heat;Traction;Ultrasound;Functional mobility training;Therapeutic activities;Therapeutic exercise;Neuromuscular re-education;Patient/family education;Manual techniques;Passive range of motion;Dry needling;Taping;Spinal Manipulations    PT Next Visit Plan Review initial HEP;  progress postural strengthening; reinforce awareness of neutral spine posture and proper body mechanics    PT Home Exercise Plan 9/16 - snow angel pec stretch, chin tuck (supine & sitting), UT & LS stretches, 3-way doorway pec stretch, scap retraction    Consulted and Agree with Plan of Care Patient           Patient will benefit from skilled therapeutic intervention in order to improve the following deficits and impairments:  Decreased activity tolerance, Decreased range of motion, Decreased strength, Hypomobility, Increased fascial restricitons, Increased muscle spasms, Impaired perceived functional ability, Impaired flexibility, Impaired sensation, Impaired UE functional use, Improper body mechanics, Postural dysfunction, Pain  Visit Diagnosis: Radiculopathy, cervical region  Cervicalgia  Abnormal posture  Muscle weakness (generalized)  Other muscle spasm     Problem List Patient Active Problem List   Diagnosis Date Noted  . Cervical radiculopathy 12/10/2019  . Headache 05/12/2015  . OBESITY, NOS 05/31/2006  . MOOD DISORDER 05/31/2006    06/02/2006, PT, MPT 12/18/2019, 6:25 PM  [facility] [address]  [address] [place], [place], [place] Phone: [phone]   Fax:  [phone]  Name: Austin Blanchard MRN: Austin Blanchard Date of Birth: 1990-07-24

## 2019-12-18 NOTE — Patient Instructions (Addendum)
    Home exercise program created by Anelisse Jacobson, PT.  For questions, please contact Nikoletta Varma via phone at [phone] or email at [email or web address]  [facility] [place] [address]  [address] [place], [place], [address] Phone: [phone]   Fax:  [phone]    

## 2019-12-23 ENCOUNTER — Telehealth: Payer: Self-pay | Admitting: Family Medicine

## 2019-12-23 NOTE — Telephone Encounter (Signed)
Patient's Mom called states he is still having significant pain in neck/shoulder  & feels Rx aren't working so he stop taking them-- Pt has been to only 1 PT trmt --- Pt's mom wonders if provider can give some steroid trmt / Rx for prednisone for inflammation.  --Forwarding message to Dr. Jordan Likes for review & response.  Call patient [phone]  -glh

## 2019-12-24 MED ORDER — PREDNISONE 5 MG PO TABS: ORAL_TABLET | ORAL | 0 refills | Status: DC

## 2019-12-24 NOTE — Telephone Encounter (Signed)
Left VM for patient. If he calls back please have him speak with a nurse/CMA and inform I sent in some prednisone.   If any questions then please take the best time and phone number to call and I will try to call him back.   Myra Rude, MD [facility] 12/24/2019, 8:07 AM

## 2020-01-01 ENCOUNTER — Encounter: Payer: Self-pay | Admitting: Physical Therapy

## 2020-01-01 ENCOUNTER — Ambulatory Visit: Payer: Self-pay | Admitting: Physical Therapy

## 2020-01-01 ENCOUNTER — Other Ambulatory Visit: Payer: Self-pay

## 2020-01-01 DIAGNOSIS — M5412 Radiculopathy, cervical region: Secondary | ICD-10-CM

## 2020-01-01 DIAGNOSIS — M62838 Other muscle spasm: Secondary | ICD-10-CM

## 2020-01-01 DIAGNOSIS — M6281 Muscle weakness (generalized): Secondary | ICD-10-CM

## 2020-01-01 DIAGNOSIS — M542 Cervicalgia: Secondary | ICD-10-CM

## 2020-01-01 DIAGNOSIS — R293 Abnormal posture: Secondary | ICD-10-CM

## 2020-01-01 NOTE — Patient Instructions (Signed)
    Home exercise program created by Aldene Hendon, PT.  For questions, please contact Lauralee Waters via phone at [phone] or email at [email or web address]  [facility] [place] [address]  [address] [place], [place], [address] Phone: [phone]   Fax:  [phone]    

## 2020-01-01 NOTE — Therapy (Addendum)
[facility] [address]  [address] [place], [place], [address] Phone: [phone]   Fax:  [phone]  Physical Therapy Treatment  Patient Details  Name: Austin Blanchard MRN: [id number] Date of Birth: October 19, 1990 Referring Provider (PT): Clare Gandy, MD   Encounter Date: 01/01/2020   PT End of Session - 01/01/20 1422    Visit Number 2    Number of Visits 8    Date for PT Re-Evaluation 02/12/20    Authorization Type Self-pay    PT Start Time 1422   pt arrived late   PT Stop Time 1456    PT Time Calculation (min) 34 min    Activity Tolerance Patient tolerated treatment well    Behavior During Therapy [facility] for tasks assessed/performed           Past Medical History:  Diagnosis Date  . Anxiety   . Head pain     Past Surgical History:  Procedure Laterality Date  . NO PAST SURGERIES    . PILONIDAL CYST DRAINAGE      There were no vitals filed for this visit.   Subjective Assessment - 01/01/20 1424    Subjective Pt reports pain has been consistently less although does still seem to flare-up from time to time.    Diagnostic tests Cervical x-ray 12/06/19: Negative cervical spine radiographs.    Patient Stated Goals "want the pain to go away"    Currently in Pain? Yes    Pain Score 4     Pain Location Neck    Pain Orientation Left    Pain Descriptors / Indicators Dull;Aching    Pain Type Acute pain    Pain Radiating Towards upper L  shoulder    Pain Frequency Constant                             [facility] Adult PT Treatment/Exercise - 01/01/20 1422      Exercises   Exercises Neck      Neck Exercises: Machines for Strengthening   UBE (Upper Arm Bike) L1.0 x 4' (2' fwd/2' back)      Neck Exercises: Theraband   Shoulder Extension 10 reps;Red    Shoulder Extension Limitations + scap retraction & depression; standing    Rows 10 reps;Red    Rows Limitations cues to avoid shoulder shrug;  standing    Shoulder External Rotation 10 reps;Red    Shoulder External Rotation Limitations + scap retraction; hooklying on pool noodle    Horizontal ABduction 10 reps;Red    Horizontal ABduction Limitations + scap retraction; hooklying on pool noodle    Other Theraband Exercises Alt red TB UE diagonals + scap retraction 10 x 5"; hooklying on pool noodle      Manual Therapy   Manual Therapy Soft tissue mobilization;Myofascial release;Other (comment)    Soft tissue mobilization STM/DTM to L UT & LS    Myofascial Release manual TPR to L UT    Other Manual Therapy Reviewed use of Theracane and/or tennis ball for self TPR for upper/posterior shoulder.      Neck Exercises: Stretches   Chest Stretch 5 reps;10 seconds    Chest Stretch Limitations supine "snow angels" over pool noodle                  PT Education - 01/01/20 1456    Education Details HEP update - red TB scap retraction + standing rows/shoulder  extension and supine horiz ABD/ER/diagonals    Person(s) Educated Patient    Methods Explanation;Demonstration;Verbal cues;Tactile cues;Handout    Comprehension Verbalized understanding;Verbal cues required;Tactile cues required;Returned demonstration;Need further instruction               PT Long Term Goals - 01/01/20 1427      PT LONG TERM GOAL #1   Title Patient will be independent with ongoing/advanced HEP    Status On-going    Target Date 02/12/20      PT LONG TERM GOAL #2   Title Patient to demonstrate ability to achieve and maintain good spinal alignment/posturing    Status On-going    Target Date 02/12/20      PT LONG TERM GOAL #3   Title Patient to report pain reduction in frequency and intensity by >/= 50%    Status On-going    Target Date 02/12/20      PT LONG TERM GOAL #4   Title Patient to improve cervical AROM to WFL/WNL without pain provocation    Status On-going    Target Date 02/12/20      PT LONG TERM GOAL #5   Title Patient will  demonstrate improved L shoulder strength to >/= 4+/5 and grip strength to within 10# of R hand for functional UE use    Status On-going    Target Date 02/12/20      PT LONG TERM GOAL #6   Title Patient will report no sleep disturbance due to pain    Status On-going    Target Date 02/12/20      PT LONG TERM GOAL #7   Title Patient to report ability to perform schoolwork and daily activities including lifting without pain provocation    Status On-going    Target Date 02/12/20                 Plan - 01/01/20 1456    Clinical impression statement Casimiro Needle reporting significant reduction in overall pain since initial eval but still notes that pain will flare-up at times. Given late start to PT session due to pt arriving late, pt opting to defer HEP review as he does not note any major concerns other than discomfort with supine neck retraction (deferred in favor of upright chin tuck) in order to allow time for exercise progression as pt wishing to continue with biweekly frequency due to self-pay. Progressed postural strengthening to include supine and upright theraband resisted scapular retraction and posterior shoulder strengthening - pt noting good tolerance, therefore update HEP instructions provided. Pt requesting PT manual STM/TPR as he noted a few days of relief following manual therapy on eval - addressed continue TPs/muscle tightness in L upper/posterior shoulder and provided review of instructions for self-STM using Theracane and/or tennis ball on wall.   Personal Factors and Comorbidities Time since onset of injury/illness/exacerbation;Comorbidity 3+    Comorbidities headaches, anxiety, obesity    Examination-Activity Limitations Lift;Carry;Sleep;Sit;Stand;Locomotion Level    Examination-Participation Restrictions School;Other   hobbies/recreation   Rehab Potential Good    PT Frequency 1x / week   Pt wishing to try biweekly to start given self-pay but would like option to come 1x/wk  if needed   PT Duration 8 weeks    PT Treatment/Interventions ADLs/Self Care Home Management;Cryotherapy;Electrical Stimulation;Iontophoresis 4mg /ml Dexamethasone;Moist Heat;Traction;Ultrasound;Functional mobility training;Therapeutic activities;Therapeutic exercise;Neuromuscular re-education;Patient/family education;Manual techniques;Passive range of motion;Dry needling;Taping;Spinal Manipulations    PT Next Visit Plan Review HEPs as needed; progress postural strengthening; reinforce awareness of neutral spine posture and proper  body mechanics    PT Home Exercise Plan 9/16 - snow angel pec stretch, chin tuck (supine & sitting), UT & LS stretches, 3-way doorway pec stretch, scap retraction; 9/30 - red TB scap retraction + standing rows/shoulder extension and supine horiz ABD/ER/diagonals    Consulted and Agree with Plan of Care Patient           Patient will benefit from skilled therapeutic intervention in order to improve the following deficits and impairments:  Decreased activity tolerance, Decreased range of motion, Decreased strength, Hypomobility, Increased fascial restricitons, Increased muscle spasms, Impaired perceived functional ability, Impaired flexibility, Impaired sensation, Impaired UE functional use, Improper body mechanics, Postural dysfunction, Pain  Visit Diagnosis: Radiculopathy, cervical region  Cervicalgia  Abnormal posture  Muscle weakness (generalized)  Other muscle spasm     Problem List Patient Active Problem List   Diagnosis Date Noted  . Cervical radiculopathy 12/10/2019  . Headache 05/12/2015  . OBESITY, NOS 05/31/2006  . MOOD DISORDER 05/31/2006    Marry Guan, PT, MPT 01/01/2020, 3:20 PM  [facility] [address]  [address] [place], [place], [address] Phone: [phone]   Fax:  [phone]  Name: Tresten WALRATH MRN: [id number] Date of Birth: 11/06/1990

## 2020-01-12 ENCOUNTER — Encounter: Payer: Self-pay | Admitting: Family Medicine

## 2020-01-12 ENCOUNTER — Ambulatory Visit (INDEPENDENT_AMBULATORY_CARE_PROVIDER_SITE_OTHER): Payer: Self-pay | Admitting: Family Medicine

## 2020-01-12 ENCOUNTER — Other Ambulatory Visit: Payer: Self-pay

## 2020-01-12 DIAGNOSIS — M5412 Radiculopathy, cervical region: Secondary | ICD-10-CM

## 2020-01-12 NOTE — Progress Notes (Signed)
  Austin Blanchard - 29 y.o. male MRN [id number]  Date of birth: [birth year]  SUBJECTIVE:  Including CC & ROS.  Chief Complaint  Patient presents with  . Follow-up    neck    Austin Blanchard is a 29 y.o. male that is following up for his radiculopathy.  He reports significant improvement with physical therapy.  Pain is only intermittent mild in nature.   Review of Systems See HPI   HISTORY: Past Medical, Surgical, Social, and Family History Reviewed & Updated per EMR.   Pertinent Historical Findings include:  Past Medical History:  Diagnosis Date  . Anxiety   . Head pain     Past Surgical History:  Procedure Laterality Date  . NO PAST SURGERIES    . PILONIDAL CYST DRAINAGE      Family History  Problem Relation Age of Onset  . Healthy Mother   . Healthy Father   . Asthma Brother   . Diabetes Maternal Grandmother   . Stroke Maternal Grandmother     Social History   Socioeconomic History  . Marital status: Single    Spouse name: Not on file  . Number of children: 0  . Years of education: 10  . Highest education level: Not on file  Occupational History  . Occupation: Unemployed  Tobacco Use  . Smoking status: Never Smoker  . Smokeless tobacco: Never Used  Substance and Sexual Activity  . Alcohol use: No  . Drug use: No  . Sexual activity: Not on file  Other Topics Concern  . Not on file  Social History Narrative   Lives at home with his mother.   Right-handed.   No caffeine use.   Social Determinants of Health   Financial Resource Strain:   . Difficulty of Paying Living Expenses: Not on file  Food Insecurity:   . Worried About Programme researcher, broadcasting/film/video in the Last Year: Not on file  . Ran Out of Food in the Last Year: Not on file  Transportation Needs:   . Lack of Transportation (Medical): Not on file  . Lack of Transportation (Non-Medical): Not on file  Physical Activity:   . Days of Exercise per Week: Not on file  . Minutes of Exercise per  Session: Not on file  Stress:   . Feeling of Stress : Not on file  Social Connections:   . Frequency of Communication with Friends and Family: Not on file  . Frequency of Social Gatherings with Friends and Family: Not on file  . Attends Religious Services: Not on file  . Active Member of Clubs or Organizations: Not on file  . Attends Banker Meetings: Not on file  . Marital Status: Not on file  Intimate Partner Violence:   . Fear of Current or Ex-Partner: Not on file  . Emotionally Abused: Not on file  . Physically Abused: Not on file  . Sexually Abused: Not on file     PHYSICAL EXAM:  VS: BP 130/81   Pulse 90   Ht 6' (1.829 m)   Wt 250 lb (113.4 kg)   BMI 33.91 kg/m  Physical Exam Gen: NAD, alert, cooperative with exam, well-appearing   ASSESSMENT & PLAN:   Cervical radiculopathy Much improvement of his pain.  Gets intermittent pain is more periscapular.  His severe pain has gone. -Counseled on home exercise therapy and supportive care. -Continue physical therapy. -Can follow-up as needed.

## 2020-01-13 NOTE — Assessment & Plan Note (Signed)
Much improvement of his pain.  Gets intermittent pain is more periscapular.  His severe pain has gone. -Counseled on home exercise therapy and supportive care. -Continue physical therapy. -Can follow-up as needed.

## 2020-01-15 ENCOUNTER — Ambulatory Visit: Payer: Self-pay | Attending: Family Medicine

## 2020-01-15 ENCOUNTER — Other Ambulatory Visit: Payer: Self-pay

## 2020-01-15 DIAGNOSIS — M62838 Other muscle spasm: Secondary | ICD-10-CM | POA: Insufficient documentation

## 2020-01-15 DIAGNOSIS — R293 Abnormal posture: Secondary | ICD-10-CM | POA: Insufficient documentation

## 2020-01-15 DIAGNOSIS — M6281 Muscle weakness (generalized): Secondary | ICD-10-CM | POA: Insufficient documentation

## 2020-01-15 DIAGNOSIS — M5412 Radiculopathy, cervical region: Secondary | ICD-10-CM | POA: Insufficient documentation

## 2020-01-15 DIAGNOSIS — M542 Cervicalgia: Secondary | ICD-10-CM | POA: Insufficient documentation

## 2020-01-15 NOTE — Therapy (Signed)
[facility] [address]  [address] [place], [place], [address] Phone: [phone]   Fax:  [phone]  Physical Therapy Treatment  Patient Details  Name: Austin Blanchard MRN: [id number] Date of Birth: November 08, 1990 Referring Provider (PT): Clare Gandy, MD   Encounter Date: 01/15/2020   PT End of Session - 01/15/20 1409    Visit Number 3    Number of Visits 8    Date for PT Re-Evaluation 02/12/20    Authorization Type Self-pay    PT Start Time 1405    PT Stop Time 1443    PT Time Calculation (min) 38 min    Activity Tolerance Patient tolerated treatment well    Behavior During Therapy [facility] for tasks assessed/performed           Past Medical History:  Diagnosis Date  . Anxiety   . Head pain     Past Surgical History:  Procedure Laterality Date  . NO PAST SURGERIES    . PILONIDAL CYST DRAINAGE      There were no vitals filed for this visit.   Subjective Assessment - 01/15/20 1408    Subjective Pt. noting much improved pain levels over this past two weeks.    Diagnostic tests Cervical x-ray 12/06/19: Negative cervical spine radiographs.    Patient Stated Goals "want the pain to go away"    Currently in Pain? No/denies    Pain Score 0-No pain   pain up to 3-4/10 at times   Pain Location Neck    Pain Orientation Left    Pain Descriptors / Indicators Dull;Aching    Pain Type Acute pain    Pain Frequency Constant    Aggravating Factors  leaning forward or leaning over a desk pain increases for about 30 min    Multiple Pain Sites No                             [facility] Adult PT Treatment/Exercise - 01/15/20 0001      Self-Care   Self-Care Other Self-Care Comments    Posture discussion of importance of postural awareness sitting at desk at home     Other Self-Care Comments  Discussion of progressing band exercise to green band for standing retraction       Neck Exercises: Machines for  Strengthening   UBE (Upper Arm Bike) L1.0 x 6' (3' fwd/3' back)      Neck Exercises: Theraband   Shoulder Extension 10 reps;Red   2 sets; 2nd set x 10 reps green TB   Shoulder Extension Limitations minor cueing for scapular depression     Rows 10 reps;Green    Rows Limitations minor cueing to reduce scapular "hiking"    Shoulder External Rotation 10 reps;Red   Pt. without increased pain    Shoulder External Rotation Limitations + scap retraction; hooklying on pool noodle    Horizontal ABduction Red   x 12; pt. without increased pain    Horizontal ABduction Limitations Hooklying     Other Theraband Exercises Alt red TB UE diagonals + scap retraction 10 x 5"; hooklying on pool noodle      Neck Exercises: Stretches   Upper Trapezius Stretch Left;30 seconds;1 rep    Levator Stretch Left;30 seconds;1 rep    Chest Stretch 30 seconds;2 reps    Chest Stretch Limitations mid and high "snow angel" position     Other Neck Stretches rhomboids stretch  x 30 sec                   PT Education - 01/15/20 1606    Education Details HEP update;  green TB for extension and row closed in door    Person(s) Educated Patient    Methods Explanation;Demonstration;Verbal cues    Comprehension Verbalized understanding;Returned demonstration;Verbal cues required               PT Long Term Goals - 01/01/20 1427      PT LONG TERM GOAL #1   Title Patient will be independent with ongoing/advanced HEP    Status On-going    Target Date 02/12/20      PT LONG TERM GOAL #2   Title Patient to demonstrate ability to achieve and maintain good spinal alignment/posturing    Status On-going    Target Date 02/12/20      PT LONG TERM GOAL #3   Title Patient to report pain reduction in frequency and intensity by >/= 50%    Status On-going    Target Date 02/12/20      PT LONG TERM GOAL #4   Title Patient to improve cervical AROM to WFL/WNL without pain provocation    Status On-going    Target Date  02/12/20      PT LONG TERM GOAL #5   Title Patient will demonstrate improved L shoulder strength to >/= 4+/5 and grip strength to within 10# of R hand for functional UE use    Status On-going    Target Date 02/12/20      PT LONG TERM GOAL #6   Title Patient will report no sleep disturbance due to pain    Status On-going    Target Date 02/12/20      PT LONG TERM GOAL #7   Title Patient to report ability to perform schoolwork and daily activities including lifting without pain provocation    Status On-going    Target Date 02/12/20                 Plan - 01/15/20 1425    Clinical Impression Statement Austin Blanchard doing well.  Notes 80% improvement in pain since starting therapy.  Notes he is able to manage his pain with resolution of symptoms while sitting at desk if he sits up straight and is mindful of his posture.  Progressed home exercise standing and row/extension to green TB as pt. noting these exercises are getting easier.  Able to progress periscapular/postural strengthening exercises without issue today.  Ended visit pain free.    Comorbidities headaches, anxiety, obesity    Rehab Potential Good    PT Treatment/Interventions ADLs/Self Care Home Management;Cryotherapy;Electrical Stimulation;Iontophoresis 4mg /ml Dexamethasone;Moist Heat;Traction;Ultrasound;Functional mobility training;Therapeutic activities;Therapeutic exercise;Neuromuscular re-education;Patient/family education;Manual techniques;Passive range of motion;Dry needling;Taping;Spinal Manipulations    PT Next Visit Plan Review HEPs as needed; progress postural strengthening; reinforce awareness of neutral spine posture and proper body mechanics    PT Home Exercise Plan 9/16 - snow angel pec stretch, chin tuck (supine & sitting), UT & LS stretches, 3-way doorway pec stretch, scap retraction; 9/30 - red TB scap retraction + standing rows/shoulder extension and supine horiz ABD/ER/diagonals    Consulted and Agree with Plan of  Care Patient           Patient will benefit from skilled therapeutic intervention in order to improve the following deficits and impairments:  Decreased activity tolerance, Decreased range of motion, Decreased strength, Hypomobility, Increased fascial restricitons, Increased muscle spasms, Impaired  perceived functional ability, Impaired flexibility, Impaired sensation, Impaired UE functional use, Improper body mechanics, Postural dysfunction, Pain  Visit Diagnosis: Radiculopathy, cervical region  Cervicalgia  Abnormal posture  Muscle weakness (generalized)  Other muscle spasm     Problem List Patient Active Problem List   Diagnosis Date Noted  . Cervical radiculopathy 12/10/2019  . Headache 05/12/2015  . OBESITY, NOS 05/31/2006  . MOOD DISORDER 05/31/2006    Kermit Balo, PTA 01/15/20 4:11 PM   [facility] [address]  [address] [place], [place], [address] Phone: [phone]   Fax:  [phone]  Name: Austin Blanchard MRN: [id number] Date of Birth: [birth year]

## 2020-01-29 ENCOUNTER — Ambulatory Visit: Payer: Self-pay | Admitting: Physical Therapy

## 2020-01-29 ENCOUNTER — Encounter: Payer: Self-pay | Admitting: Physical Therapy

## 2020-01-29 ENCOUNTER — Other Ambulatory Visit: Payer: Self-pay

## 2020-01-29 DIAGNOSIS — R293 Abnormal posture: Secondary | ICD-10-CM

## 2020-01-29 DIAGNOSIS — M542 Cervicalgia: Secondary | ICD-10-CM

## 2020-01-29 DIAGNOSIS — M62838 Other muscle spasm: Secondary | ICD-10-CM

## 2020-01-29 DIAGNOSIS — M5412 Radiculopathy, cervical region: Secondary | ICD-10-CM

## 2020-01-29 DIAGNOSIS — M6281 Muscle weakness (generalized): Secondary | ICD-10-CM

## 2020-01-29 NOTE — Therapy (Addendum)
[facility] [address]  [place], [place], [address] Phone: [phone]   Fax:  [phone]  Physical Therapy Treatment / Progress Note / Discharge Summary  Patient Details  Name: Austin Blanchard MRN: [id number] Date of Birth: 1990-05-14 Referring Provider (PT): Clearance Coots, MD   Encounter Date: 01/29/2020   PT End of Session - 01/29/20 1406    Visit Number 4    Number of Visits 8    Date for PT Re-Evaluation 02/12/20    Authorization Type Self-pay    PT Start Time 1357    PT Stop Time 1440    PT Time Calculation (min) 43 min    Activity Tolerance Patient tolerated treatment well    Behavior During Therapy [facility] for tasks assessed/performed           Past Medical History:  Diagnosis Date  . Anxiety   . Head pain     Past Surgical History:  Procedure Laterality Date  . NO PAST SURGERIES    . PILONIDAL CYST DRAINAGE      There were no vitals filed for this visit.   Subjective Assessment - 01/29/20 1404    Subjective Dontea doing ok.  Notes no signficant pain since last visit.  Has had some L upper shoulder/neck pain when he leans on his L arm which lasts ~ 15 min.    Diagnostic tests Cervical x-ray 12/06/19: Negative cervical spine radiographs.    Patient Stated Goals "want the pain to go away"    Currently in Pain? No/denies    Pain Score 0-No pain   Pain rising to a 2/10 at most   Pain Location Neck    Pain Orientation Left    Pain Descriptors / Indicators Dull;Aching    Pain Type Acute pain    Pain Frequency Constant              [facility] PT Assessment - 01/29/20 1357      Assessment   Medical Diagnosis Cervical radiculopathy    Referring Provider (PT) Clearance Coots, MD    Onset Date/Surgical Date 11/23/19    Hand Dominance Right    Next MD Visit PRN      AROM   Cervical Flexion 54 - mild pain & pulling    Cervical Extension 52    Cervical - Right Side Bend 45    Cervical -  Left Side Bend 48    Cervical - Right Rotation 74    Cervical - Left Rotation 80      Strength   Right Shoulder Flexion 5/5    Right Shoulder ABduction 5/5    Right Shoulder Internal Rotation 5/5    Right Shoulder External Rotation 5/5    Left Shoulder Flexion 5/5    Left Shoulder ABduction 5/5    Left Shoulder Internal Rotation 5/5    Left Shoulder External Rotation 5/5    Right Hand Grip (lbs) 48.67   55, 45, 46   Left Hand Grip (lbs) 38   36, 40, 38                        [facility] Adult PT Treatment/Exercise - 01/29/20 1357      Exercises   Exercises Neck      Neck Exercises: Machines for Strengthening   UBE (Upper Arm Bike) L1.5 x 6' (3' fwd/3' back)      Neck Exercises: Theraband   Shoulder Extension  10 reps;Green    Shoulder Extension Limitations + scap retraction & depression; standing - good awareness of scapular activation    Rows 10 reps;Green    Shoulder External Rotation 10 reps;Red    Shoulder External Rotation Limitations + scap retraction; standing against pool noodle on wall    Horizontal ABduction 10 reps;Red    Horizontal ABduction Limitations + scap retraction; standing against pool noodle on wall    Other Theraband Exercises Alt red TB UE diagonals + scap retraction 10 x 5"; standing against pool noodle on wall      Neck Exercises: Stretches   Chest Stretch 30 seconds;2 reps    Chest Stretch Limitations mid and high "snow angel" position - doorway stretch                       PT Long Term Goals - 01/29/20 1427      PT LONG TERM GOAL #1   Title Patient will be independent with ongoing/advanced HEP    Status Achieved   01/29/20     PT LONG TERM GOAL #2   Title Patient to demonstrate ability to achieve and maintain good spinal alignment/posturing    Status Achieved   01/29/20     PT LONG TERM GOAL #3   Title Patient to report pain reduction in frequency and intensity by >/= 50%    Status Achieved   01/29/20 - Pt reporting  98% improvement     PT LONG TERM GOAL #4   Title Patient to improve cervical AROM to WFL/WNL without pain provocation    Status Achieved   01/29/20     PT LONG TERM GOAL #5   Title Patient will demonstrate improved L shoulder strength to >/= 4+/5 and grip strength to within 10# of R hand for functional UE use    Status Achieved   01/29/20     PT LONG TERM GOAL #6   Title Patient will report no sleep disturbance due to pain    Status Achieved   01/29/20     PT LONG TERM GOAL #7   Title Patient to report ability to perform schoolwork and daily activities including lifting without pain provocation    Status Achieved   01/29/20                Plan - 01/29/20 1427    Clinical Impression Statement Legrand Como reports 98% improvement in his pain since starting PT. He notes improved awareness of posture and feels that he is able to maintain better posture with most daily activities allowing him to complete all daily tasks w/o limitation due to pain. His cervical ROM is now WFL/WNL, B shoulder strength 5/5 and L grip strength w/in 10# of R. All goals now met and pt feels confident transitioning to his HEP after final review/update today but would like to remain on hold for 30 days In the event that any further issues arise.    Comorbidities headaches, anxiety, obesity    Rehab Potential Good    PT Treatment/Interventions ADLs/Self Care Home Management;Cryotherapy;Electrical Stimulation;Iontophoresis 54m/ml Dexamethasone;Moist Heat;Traction;Ultrasound;Functional mobility training;Therapeutic activities;Therapeutic exercise;Neuromuscular re-education;Patient/family education;Manual techniques;Passive range of motion;Dry needling;Taping;Spinal Manipulations    PT Next Visit Plan 30-day hold    PT Home Exercise Plan 9/16 - snow angel pec stretch, chin tuck (supine & sitting), UT & LS stretches, 3-way doorway pec stretch, scap retraction; 9/30 - red TB scap retraction + standing rows/shoulder  extension and supine horiz ABD/ER/diagonals  Consulted and Agree with Plan of Care Patient           Patient will benefit from skilled therapeutic intervention in order to improve the following deficits and impairments:  Decreased activity tolerance, Decreased range of motion, Decreased strength, Hypomobility, Increased fascial restricitons, Increased muscle spasms, Impaired perceived functional ability, Impaired flexibility, Impaired sensation, Impaired UE functional use, Improper body mechanics, Postural dysfunction, Pain  Visit Diagnosis: Radiculopathy, cervical region  Cervicalgia  Abnormal posture  Muscle weakness (generalized)  Other muscle spasm     Problem List Patient Active Problem List   Diagnosis Date Noted  . Cervical radiculopathy 12/10/2019  . Headache 05/12/2015  . OBESITY, NOS 05/31/2006  . MOOD DISORDER 05/31/2006    Percival Spanish, PT, MPT 01/29/2020, 8:09 PM  [facility] [address]  [place], [place], [address] Phone: [phone]   Fax:  [phone]  Name: RONDEY FALLEN MRN: [id number] Date of Birth: 1991/02/21   PHYSICAL THERAPY DISCHARGE SUMMARY  Visits from Start of Care: 4  Current functional level related to goals / functional outcomes:   Refer to above clinical impression for status as of last visit on 01/29/2020. Patient was placed on hold for 30 days and has not needed to return to PT, therefore will proceed with discharge from PT for this episode.   Remaining deficits:   As above.   Education / Equipment:   HEP  Plan: Patient agrees to discharge.  Patient goals were met. Patient is being discharged due to meeting the stated rehab goals.  ?????     Percival Spanish, PT, MPT 03/08/20, 4:56 PM  [facility] [address]  [place], [place], [address] Phone: [phone]   Fax:   [phone]

## 2020-03-03 ENCOUNTER — Telehealth: Payer: Self-pay | Admitting: Family Medicine

## 2020-03-03 DIAGNOSIS — M5412 Radiculopathy, cervical region: Secondary | ICD-10-CM

## 2020-03-03 MED ORDER — PREDNISONE 5 MG PO TABS: ORAL_TABLET | ORAL | 0 refills | Status: DC

## 2020-03-03 NOTE — Telephone Encounter (Signed)
Pt's mom called twice to say his shoulder is giving him extreme pain --Pls call him @ [phone]

## 2020-03-03 NOTE — Telephone Encounter (Signed)
Spoke with patient. Having worsening or previous symptoms. Will try prednisone. Can order MRI to evaluate for nerve impingement due to worsening of his symptoms.   Myra Rude, MD [facility] 03/03/2020, 5:23 PM

## 2020-03-04 ENCOUNTER — Encounter: Payer: Self-pay | Admitting: Physical Therapy

## 2020-03-08 ENCOUNTER — Other Ambulatory Visit: Payer: Self-pay

## 2020-03-08 ENCOUNTER — Ambulatory Visit (INDEPENDENT_AMBULATORY_CARE_PROVIDER_SITE_OTHER): Payer: Self-pay

## 2020-03-08 DIAGNOSIS — M5020 Other cervical disc displacement, unspecified cervical region: Secondary | ICD-10-CM

## 2020-03-08 DIAGNOSIS — M5412 Radiculopathy, cervical region: Secondary | ICD-10-CM

## 2020-03-08 DIAGNOSIS — M4802 Spinal stenosis, cervical region: Secondary | ICD-10-CM

## 2020-03-12 ENCOUNTER — Encounter: Payer: Self-pay | Admitting: Family Medicine

## 2020-03-12 ENCOUNTER — Telehealth (INDEPENDENT_AMBULATORY_CARE_PROVIDER_SITE_OTHER): Payer: Self-pay | Admitting: Family Medicine

## 2020-03-12 DIAGNOSIS — M5412 Radiculopathy, cervical region: Secondary | ICD-10-CM

## 2020-03-12 MED ORDER — HYDROCODONE-ACETAMINOPHEN 5-325 MG PO TABS: 1.0000 | ORAL_TABLET | Freq: Three times a day (TID) | ORAL | 0 refills | Status: DC | PRN

## 2020-03-12 NOTE — Assessment & Plan Note (Signed)
MRI revealing for spinal stenosis and foraminal stenosis. Have tried medications and PT. Pain is  Worsening.  - counseled on home exercises therapy and supportive care - norco  - epidural

## 2020-03-12 NOTE — Progress Notes (Signed)
Unable to reach patient. Will call in norco for pain. Will have schedule follow up.

## 2020-03-15 ENCOUNTER — Telehealth: Payer: Self-pay | Admitting: Family Medicine

## 2020-03-16 ENCOUNTER — Ambulatory Visit (INDEPENDENT_AMBULATORY_CARE_PROVIDER_SITE_OTHER): Payer: Self-pay | Admitting: Family Medicine

## 2020-03-16 ENCOUNTER — Encounter: Payer: Self-pay | Admitting: Family Medicine

## 2020-03-16 ENCOUNTER — Other Ambulatory Visit: Payer: Self-pay

## 2020-03-16 VITALS — BP 136/84 | HR 86 | Ht 72.0 in | Wt 250.0 lb

## 2020-03-16 DIAGNOSIS — M5412 Radiculopathy, cervical region: Secondary | ICD-10-CM

## 2020-03-16 MED ORDER — CELECOXIB 200 MG PO CAPS: ORAL_CAPSULE | ORAL | 2 refills | Status: DC

## 2020-03-16 NOTE — Progress Notes (Signed)
  Austin Blanchard - 29 y.o. male MRN [id number]  Date of birth: 09-Apr-1990  SUBJECTIVE:  Including CC & ROS.  Chief Complaint  Patient presents with  . Follow-up    Left shoulder / MRI results    Austin Blanchard is a 29 y.o. male that is following up after the MRI of his cervical spine.  This was showing different areas of spinal stenosis as well as foraminal stenosis.  His pain is occurring mainly on the left side.   Review of Systems See HPI   HISTORY: Past Medical, Surgical, Social, and Family History Reviewed & Updated per EMR.   Pertinent Historical Findings include:  Past Medical History:  Diagnosis Date  . Anxiety   . Head pain     Past Surgical History:  Procedure Laterality Date  . NO PAST SURGERIES    . PILONIDAL CYST DRAINAGE      Family History  Problem Relation Age of Onset  . Healthy Mother   . Healthy Father   . Asthma Brother   . Diabetes Maternal Grandmother   . Stroke Maternal Grandmother     Social History   Socioeconomic History  . Marital status: Single    Spouse name: Not on file  . Number of children: 0  . Years of education: 10  . Highest education level: Not on file  Occupational History  . Occupation: Unemployed  Tobacco Use  . Smoking status: Never Smoker  . Smokeless tobacco: Never Used  Substance and Sexual Activity  . Alcohol use: No  . Drug use: No  . Sexual activity: Not on file  Other Topics Concern  . Not on file  Social History Narrative   Lives at home with his mother.   Right-handed.   No caffeine use.   Social Determinants of Health   Financial Resource Strain: Not on file  Food Insecurity: Not on file  Transportation Needs: Not on file  Physical Activity: Not on file  Stress: Not on file  Social Connections: Not on file  Intimate Partner Violence: Not on file     PHYSICAL EXAM:  VS: BP 136/84   Pulse 86   Ht 6' (1.829 m)   Wt 250 lb (113.4 kg)   BMI 33.91 kg/m  Physical Exam Gen: NAD,  alert, cooperative with exam, well-appearing     ASSESSMENT & PLAN:   Cervical radiculopathy Cervical spine MRI was revealing for stenosis on the left side at a few different levels.  Likely contributing some of his intermittent ongoing pain. -Counseled on home exercise therapy and supportive care. -Soft collar. -Celebrex. -Could consider epidural.

## 2020-03-16 NOTE — Assessment & Plan Note (Signed)
Cervical spine MRI was revealing for stenosis on the left side at a few different levels.  Likely contributing some of his intermittent ongoing pain. -Counseled on home exercise therapy and supportive care. -Soft collar. -Celebrex. -Could consider epidural.

## 2020-03-16 NOTE — Patient Instructions (Signed)
Good to see you Please try heat  Please try the soft collar  We could pursue integrative medicine if the symptoms persists.   Please send me a message in MyChart with any questions or updates.  I'll what to hear from you about updates and if you want to pursue an epidural.   --Dr. Jordan Likes

## 2020-03-18 ENCOUNTER — Encounter: Payer: Self-pay | Admitting: Physical Therapy

## 2020-03-22 ENCOUNTER — Ambulatory Visit: Payer: Self-pay | Admitting: Family Medicine

## 2020-06-14 ENCOUNTER — Encounter: Payer: Self-pay | Admitting: Family Medicine

## 2020-09-15 ENCOUNTER — Telehealth: Payer: Self-pay | Admitting: Physician Assistant

## 2020-09-15 DIAGNOSIS — B9689 Other specified bacterial agents as the cause of diseases classified elsewhere: Secondary | ICD-10-CM

## 2020-09-15 DIAGNOSIS — J028 Acute pharyngitis due to other specified organisms: Secondary | ICD-10-CM

## 2020-09-15 MED ORDER — AMOXICILLIN 500 MG PO TABS: 500.0000 mg | ORAL_TABLET | Freq: Two times a day (BID) | ORAL | 0 refills | Status: AC

## 2020-09-15 NOTE — Patient Instructions (Signed)
  Nolon Bussing Severtson, thank you for joining Piedad Climes, PA-C for today's virtual visit.  While this provider is not your primary care provider (PCP), if your PCP is located in our provider database this encounter information will be shared with them immediately following your visit.  Consent: (Patient) Austin Blanchard provided verbal consent for this virtual visit at the beginning of the encounter.  Current Medications:  Current Outpatient Medications:    amoxicillin (AMOXIL) 500 MG tablet, Take 1 tablet (500 mg total) by mouth 2 (two) times daily., Disp: 20 tablet, Rfl: 0   b complex vitamins tablet, Take 1 tablet by mouth daily., Disp: , Rfl:    celecoxib (CELEBREX) 200 MG capsule, One to 2 tablets by mouth daily as needed for pain., Disp: 60 capsule, Rfl: 2   Cholecalciferol (VITAMIN D PO), Take 1 tablet by mouth daily. , Disp: , Rfl:    Multiple Vitamin (MULTIVITAMIN WITH MINERALS) TABS tablet, Take 1 tablet by mouth daily., Disp: , Rfl:    Medications ordered in this encounter:  Meds ordered this encounter  Medications   amoxicillin (AMOXIL) 500 MG tablet    Sig: Take 1 tablet (500 mg total) by mouth 2 (two) times daily.    Dispense:  20 tablet    Refill:  0    Order Specific Question:   Supervising Provider    Answer:   Hyacinth Meeker, BRIAN [[id number]]     *If you need refills on other medications prior to your next appointment, please contact your pharmacy*  Follow-Up: Call back or seek an in-person evaluation if the symptoms worsen or if the condition fails to improve as anticipated.  Other Instructions Please keep well-hydrated and get plenty of rest. Take antibiotic as directed with food. Alternate Tylenol and ibuprofen as needed for throat pain. Salt water gargles and a humidifier in the bedroom can also be beneficial.    If you have been instructed to have an in-person evaluation today at a local Urgent Care facility, please use the link below. It will take you to a  list of all of our available [facility], including address, phone number and hours of operation. Please do not delay care.  [facility]  If you or a family member do not have a primary care provider, use the link below to schedule a visit and establish care. When you choose a [facility] primary care physician or advanced practice provider, you gain a long-term partner in health. Find a Primary Care Provider  Learn more about [facility] in-office and virtual care options: [facility] - Get Care Now

## 2020-09-15 NOTE — Progress Notes (Signed)
Virtual Visit Consent   Austin Blanchard, you are scheduled for a virtual visit with a [facility] provider today.     Just as with appointments in the office, your consent must be obtained to participate.  Your consent will be active for this visit and any virtual visit you may have with one of our providers in the next 365 days.     If you have a MyChart account, a copy of this consent can be sent to you electronically.  All virtual visits are billed to your insurance company just like a traditional visit in the office.    As this is a virtual visit, video technology does not allow for your provider to perform a traditional examination.  This may limit your provider's ability to fully assess your condition.  If your provider identifies any concerns that need to be evaluated in person or the need to arrange testing (such as labs, EKG, etc.), we will make arrangements to do so.     Although advances in technology are sophisticated, we cannot ensure that it will always work on either your end or our end.  If the connection with a video visit is poor, the visit may have to be switched to a telephone visit.  With either a video or telephone visit, we are not always able to ensure that we have a secure connection.     I need to obtain your verbal consent now.   Are you willing to proceed with your visit today?    Austin Blanchard has provided verbal consent on 09/15/2020 for a virtual visit (video or telephone).   Piedad Climes, [place]   Date: 09/15/2020 12:10 PM   Virtual Visit via Video Note   I, Piedad Climes, connected with Austin Blanchard ([id number], Jul 08, 1990) on 09/15/20 at 12:15 PM EDT by a video-enabled telemedicine application and verified that I am speaking with the correct person using two identifiers.  Location: Patient: Virtual Visit Location Patient: Home Provider: Virtual Visit Location Provider: Home Office   I discussed the limitations of evaluation and  management by telemedicine and the availability of in person appointments. The patient expressed understanding and agreed to proceed.    History of Present Illness: Austin Blanchard is a 30 y.o. who identifies as a male who was assigned male at birth, and is being seen today for severe sore throat, bilateral, starting a few days ago. Has been associated with fever and chills. No real congestion or cough. Notes tonsillar swelling with white patches on both tonsils, with L > R. Denies any recent travel or known sick contact. Notes odynophagia but no true impaired swallowing. Has been taking ibuprofen to help his symptoms. As of today, noting anterior neck lymph nodes and tenderness. Denies any posterior adenopathy or tenderness.   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 , Disp: , Rfl:    Multiple Vitamin (MULTIVITAMIN WITH MINERALS) TABS tablet, Take 1 tablet by mouth daily., Disp: , Rfl:   Observations/Objective: Patient is well-developed, well-nourished in no acute distress.  Resting comfortably  at home.  Head is normocephalic, atraumatic.  No labored breathing. Speech is clear and coherent with logical content.  Patient is alert and oriented at baseline.  Unable to fully visualize oropharynx via video due to lighting.  Assessment and Plan: 1. Bacterial pharyngitis - amoxicillin (AMOXIL) 500 MG  tablet; Take 1 tablet (500 mg total) by mouth 2 (two) times daily.  Dispense: 20 tablet; Refill: 0 Classic symptoms. Centor criteria with score of 4 -- reasonable to treat empirically for strep. Rx Amox 500 mg BID x 10 days to take with food. Supportive measures and OTC medications reviewed with patient.   Follow Up Instructions: I discussed the assessment and treatment plan with the patient. The patient was provided an opportunity to ask questions and all were answered. The patient agreed with the plan and demonstrated an understanding of the instructions.  A copy of instructions were sent to the patient via MyChart.  The patient was advised to call back or seek an in-person evaluation if the symptoms worsen or if the condition fails to improve as anticipated.  Time:  I spent 11 minutes with the patient via telehealth technology discussing the above problems/concerns.    Piedad Climes, PA-C

## 2020-12-22 IMAGING — MR MR CERVICAL SPINE W/O CM
5 series · 38 of 48 positions shown · non-contrast
Comparison: Prior radiograph from 12/05/2017.

CLINICAL DATA: Initial evaluation for cervical radiculopathy.

EXAM:
MRI CERVICAL SPINE WITHOUT CONTRAST
TECHNIQUE: Multiplanar, multisequence MR imaging of the cervical spine was
performed. No intravenous contrast was administered.

[Series 4: T1 · sagittal · 3.0mm · 0.86mm/px · 6 of 13 slices shown]
[im 1/13]
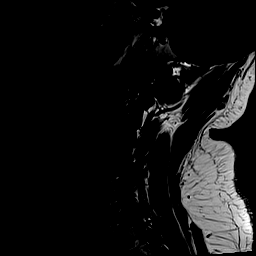
[im 3/13]
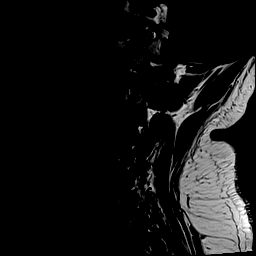
[im 5/13]
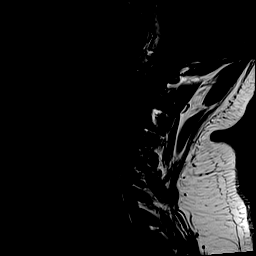
[im 8/13]
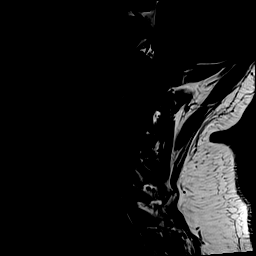
[im 10/13]
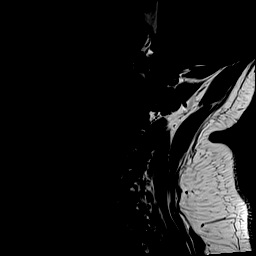
[im 13/13]
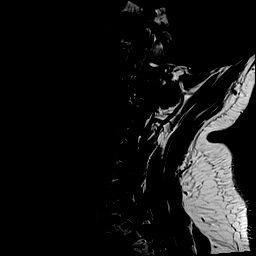

[Series 5: T2 · sagittal · 3.0mm · 0.69mm/px · 6 of 13 slices shown (1 of 2)]
[im 1/13]
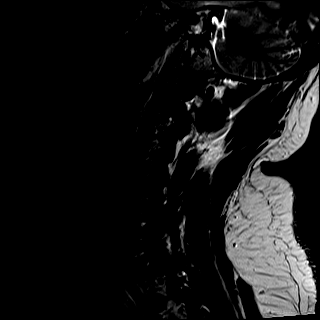
[im 3/13]
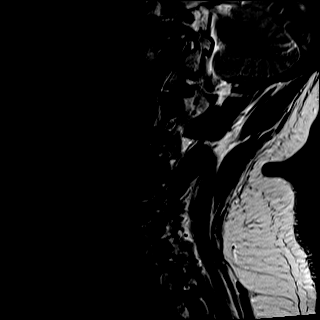
[im 5/13]
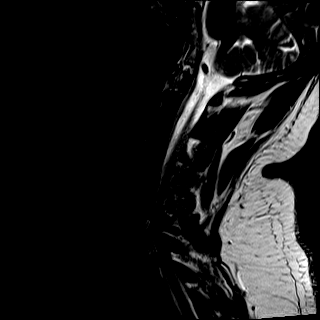
[im 8/13]
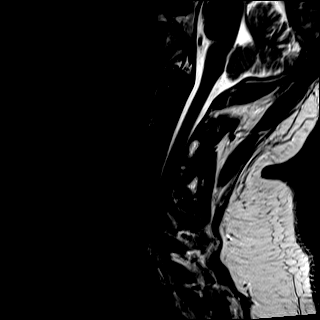
[im 10/13]
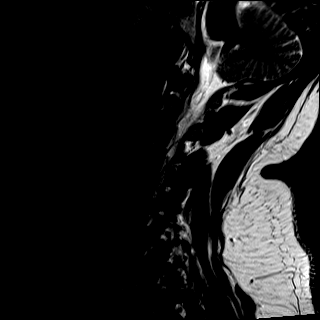
[im 13/13]
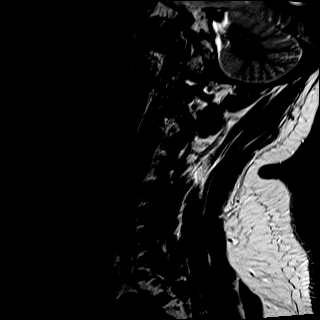

[Series 7: T2 · axial · 3.0mm · 0.62mm/px · z∈[-78,+32]mm · 12 of 31 slices shown (2 of 2)]
[im 1/31]
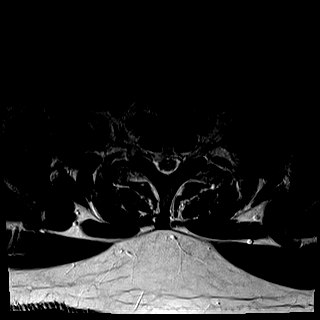
[im 3/31]
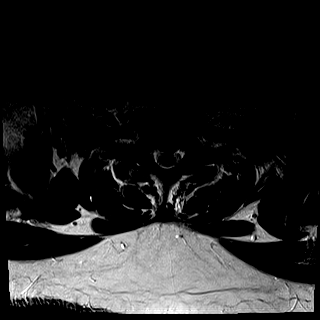
[im 5/31]
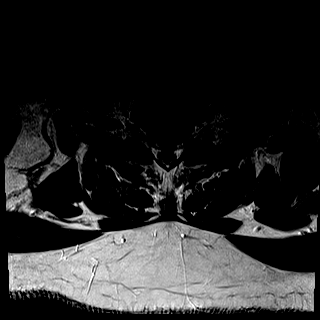
[im 7/31]
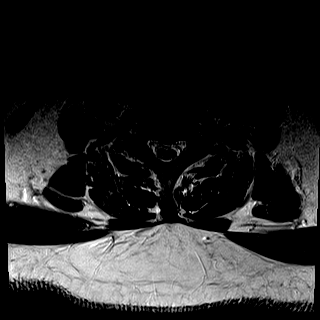
[im 9/31]
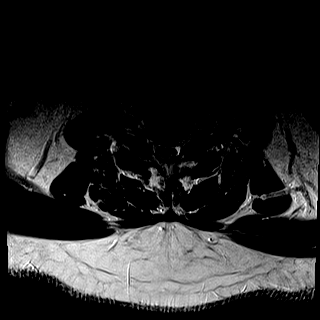
[im 11/31]
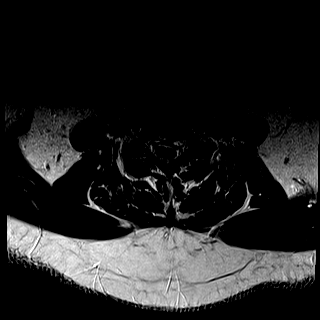
[im 13/31]
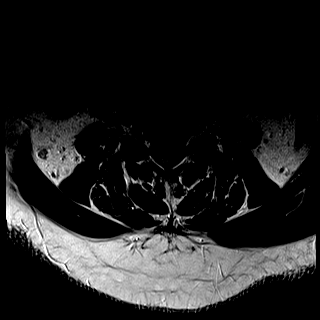
[im 16/31]
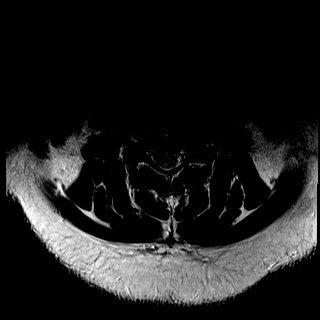
[im 18/31]
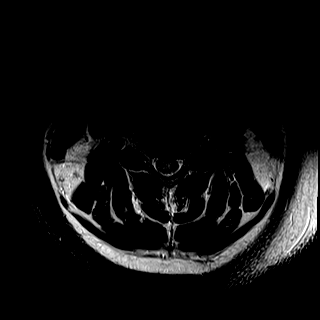
[im 22/31]
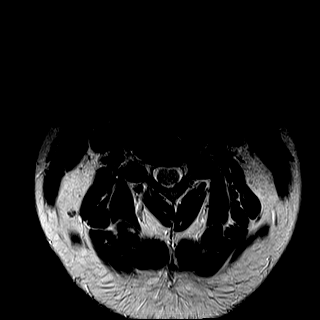
[im 26/31]
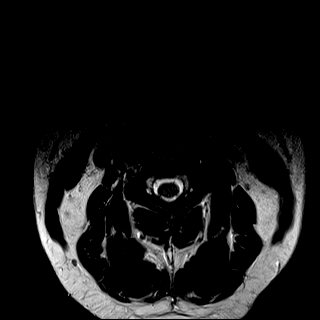
[im 31/31]
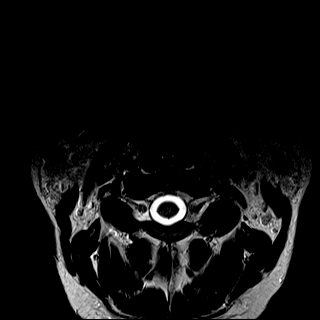

[Series 12: STIR · sagittal · 3.0mm · 0.69mm/px · 6 of 13 slices shown]
[im 1/13]
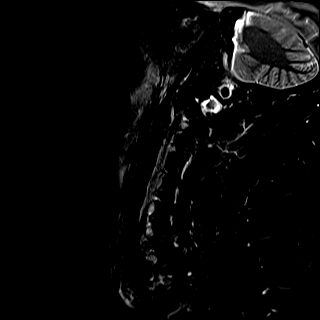
[im 3/13]
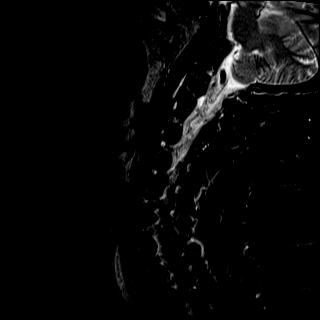
[im 5/13]
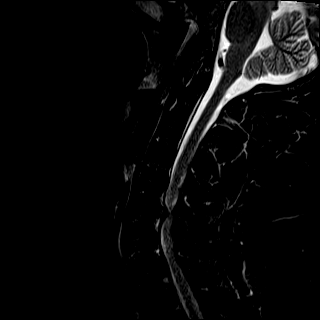
[im 8/13]
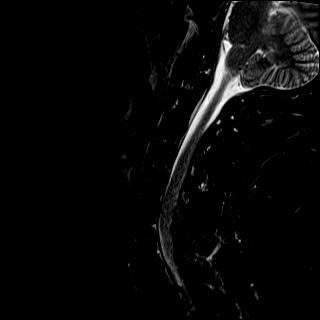
[im 10/13]
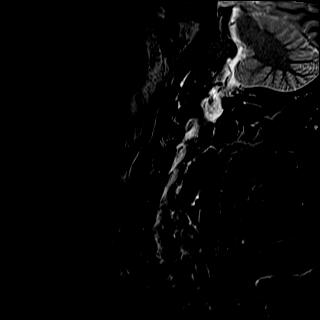
[im 13/13]
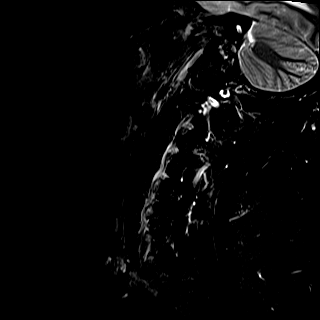

[Series 13: mpgr ax · axial · 3.0mm · 0.35mm/px · z∈[-63,+48]mm · 8 of 31 slices shown]
[im 1/31]
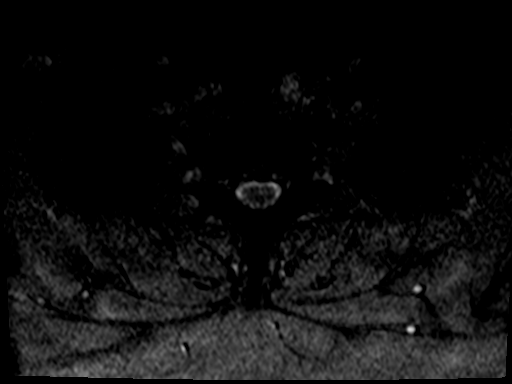
[im 5/31]
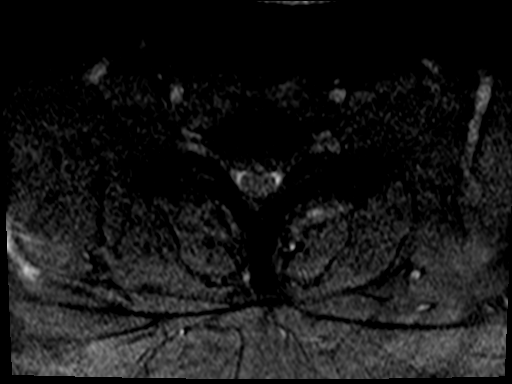
[im 9/31]
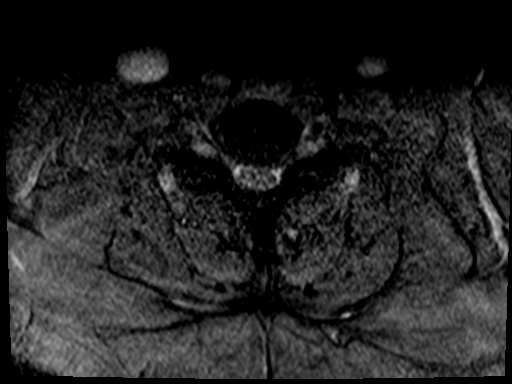
[im 13/31]
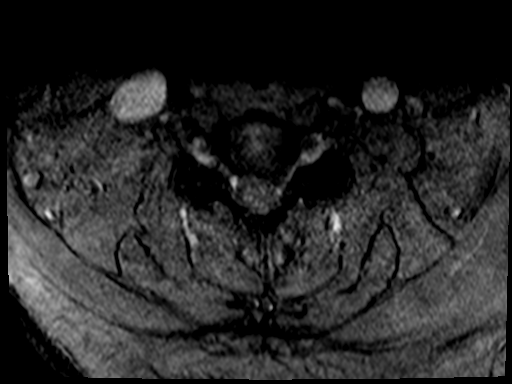
[im 18/31]
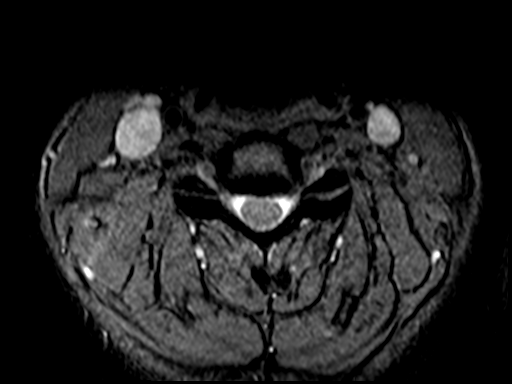
[im 22/31]
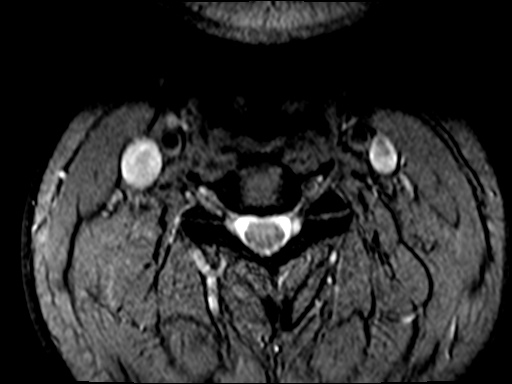
[im 26/31]
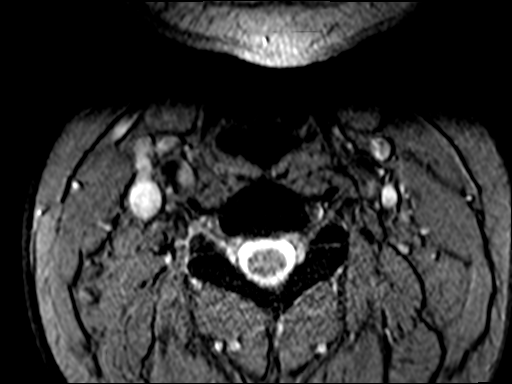
[im 31/31]
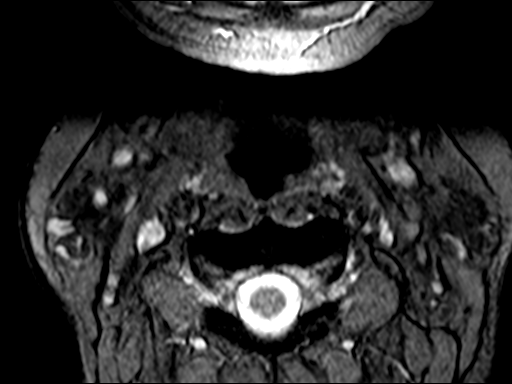

[38 of 48 positions shown; findings below may reference images not displayed]

FINDINGS: Alignment: Mild exaggeration of the normal cervical lordosis. No
listhesis.

Vertebrae: Vertebral body height maintained without acute or chronic
fracture. Bone marrow signal intensity somewhat diffusely decreased
on T1 weighted imaging, nonspecific, but most commonly related to
anemia, smoking, or obesity. Subcentimeter benign hemangioma noted
within the T1 vertebral body. No other discrete or worrisome osseous
lesions.

Cord: Normal signal and morphology.

Posterior Fossa, vertebral arteries, paraspinal tissues: Visualized
brain and posterior fossa within normal limits. Craniocervical
junction normal. Paraspinous and prevertebral soft tissues within
normal limits. Normal intravascular flow voids seen within the
vertebral arteries bilaterally.

Disc levels:

C2-C3: Unremarkable.

C3-C4:  Unremarkable.

C4-C5: Mild disc bulge, asymmetric to the left. Associated left
greater than right uncovertebral hypertrophy. Flattening and
effacement of the ventral thecal sac with resultant mild spinal
stenosis. No frank cord deformity. Moderate left C5 foraminal
stenosis. Right neural foramina remains patent.

C5-C6: Left subarticular disc extrusion indents and effaces the left
ventral thecal sac (series 13, image 20). Secondary flattening of
the left hemi cord without cord signal changes. Moderate spinal
stenosis with severe left C6 foraminal narrowing. Right neural
foramina remains patent.

C6-C7: Small central disc protrusion indents the ventral thecal sac,
partially effacing the ventral CSF. Mild spinal stenosis without
cord deformity. Foramina remain patent.

C7-T1: Central disc protrusion indents the ventral thecal sac,
contacting and mildly flattening the ventral cord (series 13, image
29). No cord signal changes. Moderate spinal stenosis. Foramina
remain patent.

T1-2: Seen only on sagittal projection. Right paracentral disc
protrusion indents the right ventral thecal sac (series 5, image 7).
Suspected mild to moderate right-sided spinal stenosis. Foramina
remain patent.
IMPRESSION: 1. Left subarticular disc extrusion at C5-6 with resultant moderate
canal and severe left C6 foraminal stenosis.
2. Central disc protrusion at C7-T1 with resultant moderate spinal
stenosis and mild cord flattening.
3. Small central disc protrusion at C6-7 with resultant mild spinal
stenosis.
4. Left eccentric disc bulge with uncovertebral hypertrophy at C4-5
with resultant mild canal and moderate left C5 foraminal stenosis.
5. Right paracentral disc protrusion at T1-2 with resultant mild to
moderate right-sided spinal stenosis.

## 2021-04-04 ENCOUNTER — Telehealth: Payer: Managed Care, Other (non HMO) | Admitting: Nurse Practitioner

## 2021-04-04 DIAGNOSIS — N50812 Left testicular pain: Secondary | ICD-10-CM

## 2021-04-04 NOTE — Patient Instructions (Signed)
°  [place] [phone]

## 2021-04-04 NOTE — Progress Notes (Signed)
Virtual Visit Consent   Austin Blanchard, you are scheduled for a virtual visit with a [facility] provider today.     Just as with appointments in the office, your consent must be obtained to participate.  Your consent will be active for this visit and any virtual visit you may have with one of our providers in the next 365 days.     If you have a MyChart account, a copy of this consent can be sent to you electronically.  All virtual visits are billed to your insurance company just like a traditional visit in the office.    As this is a virtual visit, video technology does not allow for your provider to perform a traditional examination.  This may limit your provider's ability to fully assess your condition.  If your provider identifies any concerns that need to be evaluated in person or the need to arrange testing (such as labs, EKG, etc.), we will make arrangements to do so.     Although advances in technology are sophisticated, we cannot ensure that it will always work on either your end or our end.  If the connection with a video visit is poor, the visit may have to be switched to a telephone visit.  With either a video or telephone visit, we are not always able to ensure that we have a secure connection.     I need to obtain your verbal consent now.   Are you willing to proceed with your visit today?    Austin Blanchard has provided verbal consent on 04/04/2021 for a virtual visit (video or telephone).   Viviano Simas, FNP   Date: 04/04/2021 4:58 PM   Virtual Visit via Video Note   I, Viviano Simas, connected with  Austin Blanchard  ([id number], 26-May-1990) on 04/04/21 at  5:00 PM EST by a video-enabled telemedicine application and verified that I am speaking with the correct person using two identifiers.  Location: Patient: Virtual Visit Location Patient: Home Provider: Virtual Visit Location Provider: Home Office   I discussed the limitations of evaluation and management by  telemedicine and the availability of in person appointments. The patient expressed understanding and agreed to proceed.    History of Present Illness: Austin Blanchard is a 31 y.o. who identifies as a male who was assigned male at birth, and is being seen today with complaints of left testicular pain over the past few days. The left side only.  He does sit for long hours at his job and this has happened before when he sat for long periods of time.  The pain is relieved when he lays down.   Denies any difficulty urinating.   Denies any swelling or lumps. Denies change in temperature, possible pressure.   Denies any systemic symptoms Denies body aches or fever  Denies a family history of CA   Does have PCP has not seen with this complaint   He is also interested in mental health counseling.   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 , Disp: , Rfl:    Multiple Vitamin (MULTIVITAMIN WITH MINERALS) TABS tablet, Take 1 tablet by mouth daily., Disp: , Rfl:   Observations/Objective: Patient is well-developed, well-nourished in no acute distress.  Resting comfortably at home.  Head is normocephalic, atraumatic.  No labored breathing.  Speech is clear and coherent with logical content.  Patient is alert and oriented at baseline.    Assessment and Plan: 1. Testicular pain, left Advised follow up for  assessment in person with PCP Discussed worsening symptoms that warrant UC visit- warmth, swelling, worsening pain, difficulty using the bathroom/ systemic symptoms fever     Change positions frequently, try standing desk, relieve pressure in groin with pillow or softer chair.   Will send information on local counseling services through Mychart.  Follow Up Instructions: I discussed the assessment and treatment plan with the patient. The patient was provided an opportunity to ask questions and all were answered. The patient agreed with the plan and demonstrated an understanding of the instructions.  A copy of instructions were sent to the patient via MyChart unless otherwise noted below.     The patient was advised to call back or seek an in-person evaluation if the symptoms worsen or if the condition fails to improve as anticipated.  Time:  I spent 15 minutes with the patient via telehealth technology discussing the above problems/concerns.    Viviano Simas, FNP

## 2022-03-20 ENCOUNTER — Ambulatory Visit (INDEPENDENT_AMBULATORY_CARE_PROVIDER_SITE_OTHER): Payer: Managed Care, Other (non HMO) | Admitting: Family Medicine

## 2022-03-20 VITALS — BP 114/70 | Ht 72.0 in | Wt 225.0 lb

## 2022-03-20 DIAGNOSIS — M5412 Radiculopathy, cervical region: Secondary | ICD-10-CM | POA: Diagnosis not present

## 2022-03-20 MED ORDER — CELECOXIB 200 MG PO CAPS: ORAL_CAPSULE | ORAL | 2 refills | Status: AC

## 2022-03-20 NOTE — Assessment & Plan Note (Signed)
Acute on chronic in nature.  He has a previous MRI of the cervical spine that demonstrated in origin to his pain. -Counseled on home exercise therapy and supportive care. -Will do physical therapy. -Celebrex. -Could consider an epidural.

## 2022-03-20 NOTE — Patient Instructions (Signed)
Good to see you  Please try heat  Please try the exercise Please try the anti-inflammatory as needed  I have made a referral to physical therapy  Please send me a message in MyChart with any questions or updates.  Please see me back in 6-8 weeks.   --Dr. Jordan Likes

## 2022-03-20 NOTE — Progress Notes (Signed)
  JAREL CUADRA - 31 y.o. male MRN [id number]  Date of birth: 11-Jul-1990  SUBJECTIVE:  Including CC & ROS.  No chief complaint on file.   ZAILYN ROWSER is a 31 y.o. male that is presenting with acute on chronic right-sided neck and shoulder pain.  Pain has been ongoing for the past few days.  Severe in nature and worse with certain movements.    Review of Systems See HPI   HISTORY: Past Medical, Surgical, Social, and Family History Reviewed & Updated per EMR.   Pertinent Historical Findings include:  Past Medical History:  Diagnosis Date   Anxiety    Head pain     Past Surgical History:  Procedure Laterality Date   NO PAST SURGERIES     PILONIDAL CYST DRAINAGE       PHYSICAL EXAM:  VS: BP 114/70   Ht 6' (1.829 m)   Wt 225 lb (102.1 kg)   BMI 30.52 kg/m  Physical Exam Gen: NAD, alert, cooperative with exam, well-appearing MSK:  Neurovascularly intact       ASSESSMENT & PLAN:   Cervical radiculopathy Acute on chronic in nature.  He has a previous MRI of the cervical spine that demonstrated in origin to his pain. -Counseled on home exercise therapy and supportive care. -Will do physical therapy. -Celebrex. -Could consider an epidural.

## 2022-06-12 ENCOUNTER — Ambulatory Visit: Payer: Managed Care, Other (non HMO) | Admitting: Family Medicine

## 2022-07-17 ENCOUNTER — Encounter: Payer: Self-pay | Admitting: *Deleted

## 2022-08-07 ENCOUNTER — Ambulatory Visit (INDEPENDENT_AMBULATORY_CARE_PROVIDER_SITE_OTHER): Payer: Managed Care, Other (non HMO) | Admitting: Family Medicine

## 2022-08-07 VITALS — BP 120/72 | Ht 72.0 in | Wt 230.0 lb

## 2022-08-07 DIAGNOSIS — S39012A Strain of muscle, fascia and tendon of lower back, initial encounter: Secondary | ICD-10-CM | POA: Insufficient documentation

## 2022-08-07 NOTE — Patient Instructions (Signed)
Good to see you ?Please try heat  ?Please try the exercises   ?Please send me a message in MyChart with any questions or updates.  ?Please see me back as needed.  ? ?--Dr. Merritt Mccravy ? ?

## 2022-08-07 NOTE — Progress Notes (Signed)
  Austin Blanchard - 32 y.o. male MRN [id number]  Date of birth: 14-May-1990  SUBJECTIVE:  Including CC & ROS.  No chief complaint on file.   Austin Blanchard is a 32 y.o. male that is presenting with acute low back pain.  The pain is worse with prolonged sitting.  He is also having pain with walking.  Has been ongoing for the past few weeks.  Denies any history of surgery or injury.    Review of Systems See HPI   HISTORY: Past Medical, Surgical, Social, and Family History Reviewed & Updated per EMR.   Pertinent Historical Findings include:  Past Medical History:  Diagnosis Date   Anxiety    Head pain     Past Surgical History:  Procedure Laterality Date   NO PAST SURGERIES     PILONIDAL CYST DRAINAGE       PHYSICAL EXAM:  VS: BP 120/72   Ht 6' (1.829 m)   Wt 230 lb (104.3 kg)   BMI 31.19 kg/m  Physical Exam Gen: NAD, alert, cooperative with exam, well-appearing MSK:  Neurovascularly intact       ASSESSMENT & PLAN:   Strain of lumbar region Acutely occurring.  No radicular symptoms at this time. -Counseled on home exercise therapy and supportive care. -Provided prescription for Vari Desk . -Could consider further imaging or physical therapy

## 2022-08-07 NOTE — Assessment & Plan Note (Signed)
Acutely occurring.  No radicular symptoms at this time. -Counseled on home exercise therapy and supportive care. -Provided prescription for Vari Desk . -Could consider further imaging or physical therapy
